# Patient Record
Sex: Male | Born: 1955 | Race: White | Hispanic: No | Marital: Married | Smoking: Never smoker
Health system: Southern US, Community
[De-identification: ages and names within clinical notes are randomized; demographics above are authoritative.]

## PROBLEM LIST (undated history)

## (undated) DIAGNOSIS — Z85820 Personal history of malignant melanoma of skin: Secondary | ICD-10-CM

## (undated) DIAGNOSIS — Z860101 Personal history of adenomatous and serrated colon polyps: Secondary | ICD-10-CM

## (undated) DIAGNOSIS — E785 Hyperlipidemia, unspecified: Secondary | ICD-10-CM

## (undated) DIAGNOSIS — F419 Anxiety disorder, unspecified: Secondary | ICD-10-CM

## (undated) DIAGNOSIS — G43909 Migraine, unspecified, not intractable, without status migrainosus: Secondary | ICD-10-CM

## (undated) DIAGNOSIS — E786 Lipoprotein deficiency: Secondary | ICD-10-CM

## (undated) DIAGNOSIS — E7800 Pure hypercholesterolemia, unspecified: Secondary | ICD-10-CM

## (undated) DIAGNOSIS — H409 Unspecified glaucoma: Secondary | ICD-10-CM

## (undated) DIAGNOSIS — H9313 Tinnitus, bilateral: Secondary | ICD-10-CM

## (undated) DIAGNOSIS — F32A Depression, unspecified: Secondary | ICD-10-CM

## (undated) DIAGNOSIS — D1803 Hemangioma of intra-abdominal structures: Secondary | ICD-10-CM

## (undated) DIAGNOSIS — D509 Iron deficiency anemia, unspecified: Secondary | ICD-10-CM

## (undated) DIAGNOSIS — N200 Calculus of kidney: Secondary | ICD-10-CM

## (undated) DIAGNOSIS — C801 Malignant (primary) neoplasm, unspecified: Secondary | ICD-10-CM

## (undated) DIAGNOSIS — Z8042 Family history of malignant neoplasm of prostate: Secondary | ICD-10-CM

## (undated) DIAGNOSIS — F329 Major depressive disorder, single episode, unspecified: Secondary | ICD-10-CM

## (undated) DIAGNOSIS — R1013 Epigastric pain: Secondary | ICD-10-CM

## (undated) DIAGNOSIS — F341 Dysthymic disorder: Secondary | ICD-10-CM

## (undated) DIAGNOSIS — R12 Heartburn: Secondary | ICD-10-CM

## (undated) DIAGNOSIS — R002 Palpitations: Secondary | ICD-10-CM

## (undated) DIAGNOSIS — H401130 Primary open-angle glaucoma, bilateral, stage unspecified: Secondary | ICD-10-CM

## (undated) HISTORY — DX: Dysthymic disorder: F34.1

## (undated) HISTORY — DX: Unspecified glaucoma: H40.9

## (undated) HISTORY — DX: Hyperlipidemia, unspecified: E78.5

## (undated) HISTORY — DX: Tinnitus, bilateral: H93.13

## (undated) HISTORY — DX: Migraine, unspecified, not intractable, without status migrainosus: G43.909

## (undated) HISTORY — DX: Calculus of kidney: N20.0

## (undated) HISTORY — DX: Primary open-angle glaucoma, bilateral, stage unspecified: H40.1130

## (undated) HISTORY — DX: Heartburn: R12

## (undated) HISTORY — DX: Depression, unspecified: F32.A

## (undated) HISTORY — DX: Anxiety disorder, unspecified: F41.9

## (undated) HISTORY — DX: Personal history of adenomatous and serrated colon polyps: Z86.0101

## (undated) HISTORY — DX: Epigastric pain: R10.13

## (undated) HISTORY — DX: Family history of malignant neoplasm of prostate: Z80.42

## (undated) HISTORY — DX: Gilbert syndrome: E80.4

## (undated) HISTORY — PX: OTHER SURGICAL HISTORY: SHX169

## (undated) HISTORY — DX: Lipoprotein deficiency: E78.6

## (undated) HISTORY — DX: Malignant (primary) neoplasm, unspecified: C80.1

## (undated) HISTORY — DX: Palpitations: R00.2

## (undated) HISTORY — DX: Hemangioma of intra-abdominal structures: D18.03

## (undated) HISTORY — DX: Personal history of malignant melanoma of skin: Z85.820

## (undated) HISTORY — DX: Iron deficiency anemia, unspecified: D50.9

## (undated) HISTORY — PX: COLONOSCOPY: SHX174

## (undated) HISTORY — DX: Pure hypercholesterolemia, unspecified: E78.00

---
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
## 2016-10-31 DIAGNOSIS — H409 Unspecified glaucoma: Secondary | ICD-10-CM | POA: Insufficient documentation

## 2018-09-13 ENCOUNTER — Other Ambulatory Visit: Payer: Self-pay | Admitting: Internal Medicine

## 2018-09-13 DIAGNOSIS — R221 Localized swelling, mass and lump, neck: Secondary | ICD-10-CM

## 2018-09-17 ENCOUNTER — Ambulatory Visit
Admission: RE | Admit: 2018-09-17 | Discharge: 2018-09-17 | Disposition: A | Discharge status: Home / Self Care | Payer: Commercial Managed Care - PPO | Source: Ambulatory Visit | Attending: Internal Medicine | Admitting: Internal Medicine

## 2018-09-17 DIAGNOSIS — R221 Localized swelling, mass and lump, neck: Secondary | ICD-10-CM

## 2018-09-17 MED ORDER — IOPAMIDOL (ISOVUE-300) INJECTION 61%
75.0000 mL | Freq: Once | INTRAVENOUS | Status: AC | PRN
  Administered 2018-09-17: 75 mL via INTRAVENOUS

## 2018-12-10 DIAGNOSIS — H5310 Unspecified subjective visual disturbances: Secondary | ICD-10-CM | POA: Diagnosis not present

## 2018-12-10 DIAGNOSIS — H401111 Primary open-angle glaucoma, right eye, mild stage: Secondary | ICD-10-CM | POA: Diagnosis not present

## 2018-12-10 DIAGNOSIS — H40022 Open angle with borderline findings, high risk, left eye: Secondary | ICD-10-CM | POA: Diagnosis not present

## 2018-12-10 DIAGNOSIS — H524 Presbyopia: Secondary | ICD-10-CM | POA: Diagnosis not present

## 2019-05-27 ENCOUNTER — Encounter: Payer: Self-pay | Admitting: Gastroenterology

## 2019-11-04 ENCOUNTER — Ambulatory Visit (INDEPENDENT_AMBULATORY_CARE_PROVIDER_SITE_OTHER): Payer: Medicare Other

## 2019-11-04 ENCOUNTER — Ambulatory Visit (INDEPENDENT_AMBULATORY_CARE_PROVIDER_SITE_OTHER): Payer: Medicare Other | Admitting: Podiatry

## 2019-11-04 ENCOUNTER — Other Ambulatory Visit: Payer: Self-pay

## 2019-11-04 ENCOUNTER — Encounter: Payer: Self-pay | Admitting: Podiatry

## 2019-11-04 VITALS — BP 128/79 | HR 59 | Temp 97.3°F

## 2019-11-04 DIAGNOSIS — M778 Other enthesopathies, not elsewhere classified: Secondary | ICD-10-CM

## 2019-11-04 DIAGNOSIS — B070 Plantar wart: Secondary | ICD-10-CM | POA: Diagnosis not present

## 2019-11-04 NOTE — Progress Notes (Signed)
Subjective:   Patient ID: Jeffrey Liu, male   DOB: 64 y.o.   MRN: [id number]   HPI Patient presents with a large lesion plantar aspect left heel that is been present for a long time and for the last couple years has been trying medicine on.  States he thinks is been there for at least 10 years and also he complains about pain around the lateral side of his foot stating that at times he is developed hematoma.  States that it does get sore on the heel and causes him to walk different and patient does not smoke likes to be active   Review of Systems  All other systems reviewed and are negative.       Objective:  Physical Exam Vitals and nursing note reviewed.  Constitutional:      Appearance: He is well-developed.  Pulmonary:     Effort: Pulmonary effort is normal.  Musculoskeletal:        General: Normal range of motion.  Skin:    General: Skin is warm.  Neurological:     Mental Status: He is alert.     Neurovascular status found to be intact muscle strength was found to be adequate range of motion within normal limits.  Patient is found to have a large mass plantar aspect left heel medial side measuring about 2.3 cm x 1.5 cm that is painful when pressed and has pinpoint bleeding upon debridement.  On the lateral side left foot there is some irritation around the fifth metatarsal head left but I did not note any breakdown of tissue     Assessment:  Verruca plantaris plantar aspect left heel with inflammation around the fifth MPJ left that is probably compensatory     Plan:  H&P reviewed all conditions and I recommended removal of the mass due to longstanding nature and treatments he is done himself.  I did inject with 120 mg Xylocaine with epinephrine sterile prep done and using sterile instrumentation I excised the mass entirely placed a small amount of phenol on the base in case was any cells remaining.  I applied sterile dressing and padding and instructed on elevation  compression and patient will be seen back to recheck again in the next 2 weeks and I did send this off to pathology.  X-rays were negative for any signs of calcifications or any kind of space-occupying lesions or other pathology

## 2019-11-04 NOTE — Patient Instructions (Signed)

## 2019-11-18 ENCOUNTER — Encounter: Payer: Self-pay | Admitting: Podiatry

## 2019-11-18 ENCOUNTER — Other Ambulatory Visit: Payer: Self-pay

## 2019-11-18 ENCOUNTER — Ambulatory Visit (INDEPENDENT_AMBULATORY_CARE_PROVIDER_SITE_OTHER): Payer: Medicare Other | Admitting: Podiatry

## 2019-11-18 DIAGNOSIS — B070 Plantar wart: Secondary | ICD-10-CM | POA: Diagnosis not present

## 2019-11-18 DIAGNOSIS — M778 Other enthesopathies, not elsewhere classified: Secondary | ICD-10-CM | POA: Diagnosis not present

## 2019-11-18 NOTE — Progress Notes (Signed)
Subjective:   Patient ID: Jeffrey Liu, male   DOB: 64 y.o.   MRN: [id number]   HPI Patient presents stating that it seems to be feeling pretty good in the outside the foot seems pretty good and it still draining but much better than previous   ROS      Objective:  Physical Exam  Neurovascular status intact with patient's plantar site healing well with a good granular base no drainage currently and mild inflammation outside fifth metatarsal head left but improved     Assessment:  Doing well with verruca plantaris plantar left that was excised and inflamed fifth MPJ left     Plan:  H&P reviewed condition how good it looks and reapplied sterile dressing and we will attempt to get the pathology back from the lab.  Patient will be seen back on an as-needed basis and if the fifth MPJ gets sore may require treatment

## 2019-12-20 ENCOUNTER — Telehealth: Payer: Self-pay | Admitting: Gastroenterology

## 2019-12-20 NOTE — Telephone Encounter (Signed)
Dr. Rush Landmark is Doc of the Day for 12/20/19 PM. Records placed on his desk for review.  Patient, Dr. Ebony Hail states that he had a poor prep from [facility] last year. He states that he prepped like he should but still had a negative outcome and was told to come back this year for another colon. Patient does not want to go back to [facility] stating that he felt like he did not get good care there.

## 2019-12-20 NOTE — Telephone Encounter (Signed)
I have reviewed the records that are available.    Looks like the patient had history of colon polyps in 2015 both a sessile serrated adenoma as well as a tubular adenoma one that required a hot snare and another that was cold snared-this was done in [place].  2020 colonoscopy in [place] [facility] suggested that there was a tortuous colon with stool in the entire colon.  Not sure what preparation he took at that time.  Based on the incomplete preparation the patient does require a repeat colonoscopy for history of colon polyps.  I would recommend that the patient either undergo a week of daily MiraLAX 1-2 times daily and subsequent 1 day preparation.  Or a 2-day prep so that we ensure we have no issues with stool.    If the patient would like to be seen in clinic first to discuss things I am happy to do that and he can be scheduled as able but if he feels comfortable with notation above then he can be scheduled directly for colonoscopy.  We will have notes scanned into the chart.  Justice Britain, MD [facility] # [phone]

## 2019-12-23 NOTE — Telephone Encounter (Signed)
Pre Visit and Colon scheduled.

## 2020-01-08 ENCOUNTER — Ambulatory Visit (AMBULATORY_SURGERY_CENTER): Payer: Self-pay

## 2020-01-08 ENCOUNTER — Other Ambulatory Visit: Payer: Self-pay

## 2020-01-08 VITALS — Temp 98.0°F | Ht 76.0 in | Wt 189.0 lb

## 2020-01-08 DIAGNOSIS — Z1211 Encounter for screening for malignant neoplasm of colon: Secondary | ICD-10-CM

## 2020-01-08 DIAGNOSIS — Z01818 Encounter for other preprocedural examination: Secondary | ICD-10-CM

## 2020-01-08 NOTE — Progress Notes (Signed)
No egg or soy allergy known to patient  No issues with past sedation with any surgeries  or procedures, no intubation problems  No diet pills per patient No home 02 use per patient  No blood thinners per patient  Pt denies issues with constipation  No A fib or A flutter  EMMI video sent to pt's e mail   Pt aware of care partner requirements and Covid safety procedures.

## 2020-01-21 ENCOUNTER — Encounter: Payer: Self-pay | Admitting: Gastroenterology

## 2020-01-22 ENCOUNTER — Telehealth: Payer: Self-pay

## 2020-01-22 NOTE — Telephone Encounter (Signed)
Called pt to confirm that pt went for COVID test for upcoming scheduled colonoscopy.  Pt reported that he received 2nd dose of COVID vaccine in October as a part of a clinical trial.

## 2020-01-23 ENCOUNTER — Other Ambulatory Visit: Payer: Self-pay

## 2020-01-23 ENCOUNTER — Encounter: Payer: Self-pay | Admitting: Gastroenterology

## 2020-01-23 ENCOUNTER — Ambulatory Visit (AMBULATORY_SURGERY_CENTER): Payer: Medicare Other | Admitting: Gastroenterology

## 2020-01-23 VITALS — BP 106/68 | HR 52 | Temp 96.8°F | Resp 12 | Ht 76.0 in | Wt 189.0 lb

## 2020-01-23 DIAGNOSIS — D122 Benign neoplasm of ascending colon: Secondary | ICD-10-CM

## 2020-01-23 DIAGNOSIS — D125 Benign neoplasm of sigmoid colon: Secondary | ICD-10-CM

## 2020-01-23 DIAGNOSIS — Z1211 Encounter for screening for malignant neoplasm of colon: Secondary | ICD-10-CM

## 2020-01-23 DIAGNOSIS — Z86010 Personal history of colonic polyps: Secondary | ICD-10-CM | POA: Diagnosis not present

## 2020-01-23 MED ORDER — SODIUM CHLORIDE 0.9 % IV SOLN: 500.0000 mL | Freq: Once | INTRAVENOUS | Status: DC

## 2020-01-23 NOTE — Progress Notes (Signed)
Vitals-CW Temp-JB  Pt's states no medical or surgical changes since previsit or office visit. 

## 2020-01-23 NOTE — Patient Instructions (Signed)
YOU HAD AN ENDOSCOPIC PROCEDURE TODAY AT [facility]:   Refer to the procedure report that was given to you for any specific questions about what was found during the examination.  If the procedure report does not answer your questions, please call your gastroenterologist to clarify.  If you requested that your care partner not be given the details of your procedure findings, then the procedure report has been included in a sealed envelope for you to review at your convenience later.  YOU SHOULD EXPECT: Some feelings of bloating in the abdomen. Passage of more gas than usual.  Walking can help get rid of the air that was put into your GI tract during the procedure and reduce the bloating. If you had a lower endoscopy (such as a colonoscopy or flexible sigmoidoscopy) you may notice spotting of blood in your stool or on the toilet paper. If you underwent a bowel prep for your procedure, you may not have a normal bowel movement for a few days.  Please Note:  You might notice some irritation and congestion in your nose or some drainage.  This is from the oxygen used during your procedure.  There is no need for concern and it should clear up in a day or so.  SYMPTOMS TO REPORT IMMEDIATELY:   Following lower endoscopy (colonoscopy or flexible sigmoidoscopy):  Excessive amounts of blood in the stool  Significant tenderness or worsening of abdominal pains  Swelling of the abdomen that is new, acute  Fever of 100F or higher   For urgent or emergent issues, a gastroenterologist can be reached at any hour by calling [phone]. Do not use MyChart messaging for urgent concerns.    DIET:  We do recommend a small meal at first, but then you may proceed to your regular diet.  Drink plenty of fluids but you should avoid alcoholic beverages for 24 hours. Follow a High Fiber Diet.  MEDICATIONS: Continue present medications. Consider using supplemental fiber such as Citrucel, Fibercon,  Konsyl or Metamucil.  Please see handouts given to you by your recovery nurse.  ACTIVITY:  You should plan to take it easy for the rest of today and you should NOT DRIVE or use heavy machinery until tomorrow (because of the sedation medicines used during the test).    FOLLOW UP: Our staff will call the number listed on your records 48-72 hours following your procedure to check on you and address any questions or concerns that you may have regarding the information given to you following your procedure. If we do not reach you, we will leave a message.  We will attempt to reach you two times.  During this call, we will ask if you have developed any symptoms of COVID 19. If you develop any symptoms (ie: fever, flu-like symptoms, shortness of breath, cough etc.) before then, please call [phone].  If you test positive for Covid 19 in the 2 weeks post procedure, please call and report this information to [place].    If any biopsies were taken you will be contacted by phone or by letter within the next 1-3 weeks.  Please call us at [phone] if you have not heard about the biopsies in 3 weeks.   Thank you for allowing [place] to provide for your healthcare needs today.   SIGNATURES/CONFIDENTIALITY: You and/or your care partner have signed paperwork which will be entered into your electronic medical record.  These signatures attest to the fact that that the information above  on your After Visit Summary has been reviewed and is understood.  Full responsibility of the confidentiality of this discharge information lies with you and/or your care-partner.

## 2020-01-23 NOTE — Progress Notes (Signed)
PT taken to PACU. Monitors in place. VSS. Report given to RN. 

## 2020-01-23 NOTE — Progress Notes (Signed)
Called to room to assist during endoscopic procedure.  Patient ID and intended procedure confirmed with present staff. Received instructions for my participation in the procedure from the performing physician.  

## 2020-01-23 NOTE — Op Note (Signed)
[facility] Patient Name: Jeffrey Liu Procedure Date: 01/23/2020 8:44 AM MRN: [id number] Endoscopist: Justice Britain , MD Age: 64 Referring MD:  Date of Birth: [birth year] Gender: Male Account #: [id number] Procedure:                Colonoscopy Indications:              Surveillance: Personal history of adenomatous                            polyps on last colonoscopy > 5 years ago Medicines:                Monitored Anesthesia Care Procedure:                Pre-Anesthesia Assessment:                           - Prior to the procedure, a History and Physical                            was performed, and patient medications and                            allergies were reviewed. The patient's tolerance of                            previous anesthesia was also reviewed. The risks                            and benefits of the procedure and the sedation                            options and risks were discussed with the patient.                            All questions were answered, and informed consent                            was obtained. Prior Anticoagulants: The patient has                            taken no previous anticoagulant or antiplatelet                            agents. ASA Grade Assessment: I - A normal, healthy                            patient. After reviewing the risks and benefits,                            the patient was deemed in satisfactory condition to                            undergo the procedure.  After obtaining informed consent, the colonoscope                            was passed under direct vision. Throughout the                            procedure, the patient's blood pressure, pulse, and                            oxygen saturations were monitored continuously. The                            Colonoscope was introduced through the anus and                            advanced to the the cecum,  identified by                            appendiceal orifice and ileocecal valve. The                            colonoscopy was somewhat difficult due to a                            redundant colon. Successful completion of the                            procedure was aided by changing the patient's                            position, using manual pressure, withdrawing and                            reinserting the scope, straightening and shortening                            the scope to obtain bowel loop reduction and using                            scope torsion. The patient tolerated the procedure.                            The quality of the bowel preparation was good. The                            ileocecal valve, appendiceal orifice, and rectum                            were photographed. Scope In: 8:48:42 AM Scope Out: 9:17:37 AM Scope Withdrawal Time: 0 hours 19 minutes 17 seconds  Total Procedure Duration: 0 hours 28 minutes 55 seconds  Findings:                 The digital rectal exam findings include  hemorrhoids. Pertinent negatives include no                            palpable rectal lesions.                           The colon (entire examined portion) was moderately                            redundant.                           Four sessile polyps were found in the sigmoid colon                            (1) and ascending colon (3). The polyps were 3 to 7                            mm in size. These polyps were removed with a cold                            snare. Resection and retrieval were complete.                           Small-mouthed mulitple diverticula were found in                            the recto-sigmoid colon/sigmoid colon and ascending                            colon (single on the right side).                           Normal mucosa was found in the entire colon                            otherwise.                            Non-bleeding non-thrombosed internal hemorrhoids                            were found during retroflexion, during perianal                            exam and during digital exam. The hemorrhoids were                            Grade II (internal hemorrhoids that prolapse but                            reduce spontaneously). Complications:            No immediate complications. Estimated Blood Loss:     Estimated blood loss was minimal. Impression:               -  Hemorrhoids found on digital rectal exam.                           - Redundant colon.                           - Four 3 to 7 mm polyps in the sigmoid colon and in                            the ascending colon, removed with a cold snare.                            Resected and retrieved.                           - Diverticulosis in the recto-sigmoid colon, in the                            sigmoid colon and in the ascending colon (single on                            right side)                           - Normal mucosa in the entire examined colon.                           - Non-bleeding non-thrombosed internal hemorrhoids. Recommendation:           - The patient will be observed post-procedure,                            until all discharge criteria are met.                           - Discharge patient to home.                           - Patient has a contact number available for                            emergencies. The signs and symptoms of potential                            delayed complications were discussed with the                            patient. Return to normal activities tomorrow.                            Written discharge instructions were provided to the                            patient.                           -  High fiber diet.                           - Consider use fiber, for example Citrucel,                            Fibercon, Konsyl or Metamucil.                           -  Continue present medications.                           - Await pathology results.                           - Repeat colonoscopy in 3 years for surveillance                            based on pathology results and findings of                            adenomatous tissue.                           - The findings and recommendations were discussed                            with the patient.                           - The findings and recommendations were discussed                            with the patient's family. Justice Britain, MD 01/23/2020 9:26:17 AM

## 2020-01-27 ENCOUNTER — Telehealth: Payer: Self-pay

## 2020-01-27 NOTE — Telephone Encounter (Signed)
  Follow up Call-  Call back number 01/23/2020  Post procedure Call Back phone  # [phone]  Permission to leave phone message Yes     Patient questions:  Do you have a fever, pain , or abdominal swelling? No. Pain Score  0 *  Have you tolerated food without any problems? Yes.    Have you been able to return to your normal activities? Yes.    Do you have any questions about your discharge instructions: Diet   No. Medications  No. Follow up visit  No.  Do you have questions or concerns about your Care? No.  Actions: * If pain score is 4 or above: No action needed, pain <4.  1. Have you developed a fever since your procedure? no  2.   Have you had an respiratory symptoms (SOB or cough) since your procedure? no  3.   Have you tested positive for COVID 19 since your procedure no  4.   Have you had any family members/close contacts diagnosed with the COVID 19 since your procedure?  no   If yes to any of these questions please route to Joylene John, RN and Erenest Rasher, RN

## 2020-01-28 ENCOUNTER — Encounter: Payer: Self-pay | Admitting: Gastroenterology

## 2020-07-30 IMAGING — CT CT NECK W/ CM
5 of 6 series · 14 of 33 positions shown, 16 images · IV contrast (iopamidol)
Comparison: None.

CLINICAL DATA: Fullness of neck.  Personal history of melanoma.

Creatinine was obtained on site at [HOSPITAL] at [REDACTED].
Results: Creatinine 0.9 mg/dL.
EXAM:
CT NECK WITH CONTRAST
TECHNIQUE: Multidetector CT imaging of the neck was performed using the
standard protocol following the bolus administration of intravenous
contrast.
CONTRAST:  75mL [id number] IOPAMIDOL (8HL1HW-RHH) INJECTION 61%

[Series 3: neck 2.00 br40 s3 ax st · axial · 0.47mm/px · z∈[-739,-643]mm · 2 of 145 slices shown, 3 images]
[im 49/145  soft-tissue]
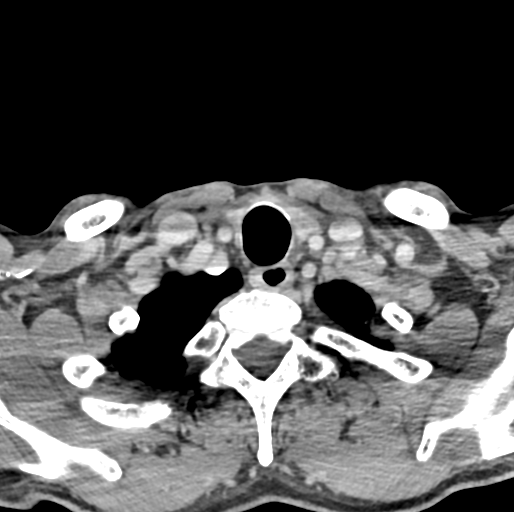
[im 49/145  bone]
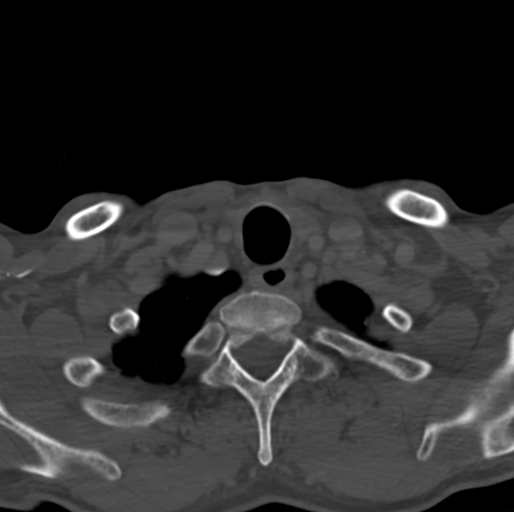
[im 97/145  bone]
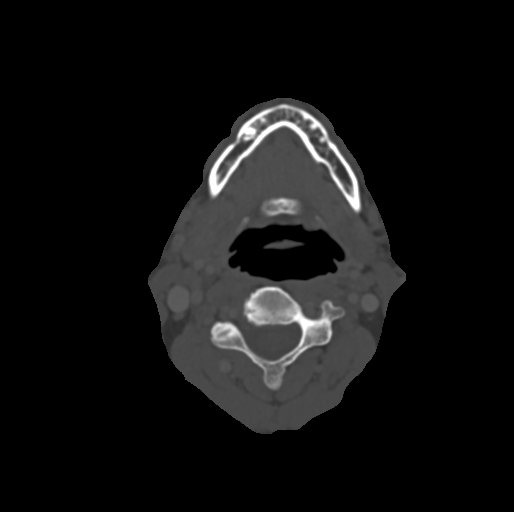

[Series 5: neck 2.00 br36 s3 ax hyoid · axial · 0.47mm/px · z∈[-756,-662]mm · 2 of 145 slices shown]
[im 49/145  bone]
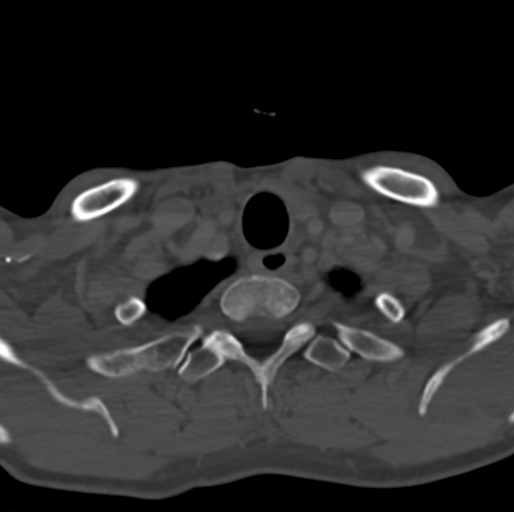
[im 97/145  bone]
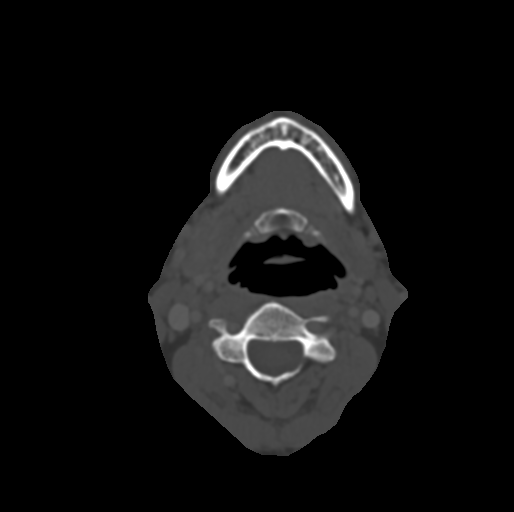

[Series 7: neck 2.00 br60 s3 ax bone · axial · 0.47mm/px · z∈[-739,-643]mm · 2 of 145 slices shown]
[im 49/145  bone]
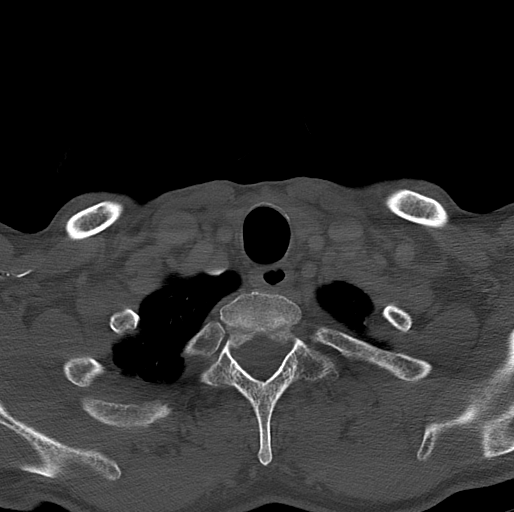
[im 97/145  bone]
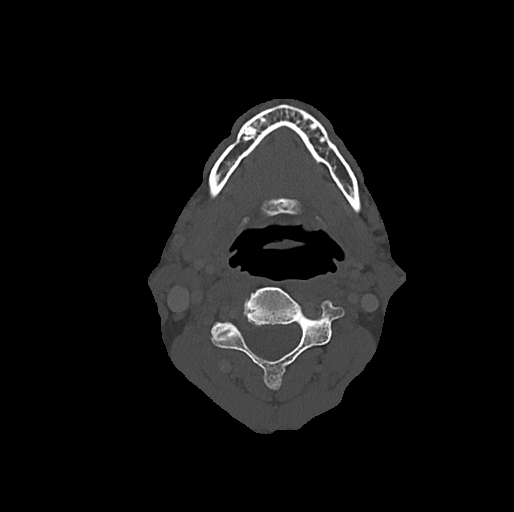

[Series 11: neck 2.00 br40 s3 (person_name) · coronal · 0.47mm/px · 3 of 120 slices shown]
[im 24/120  bone]
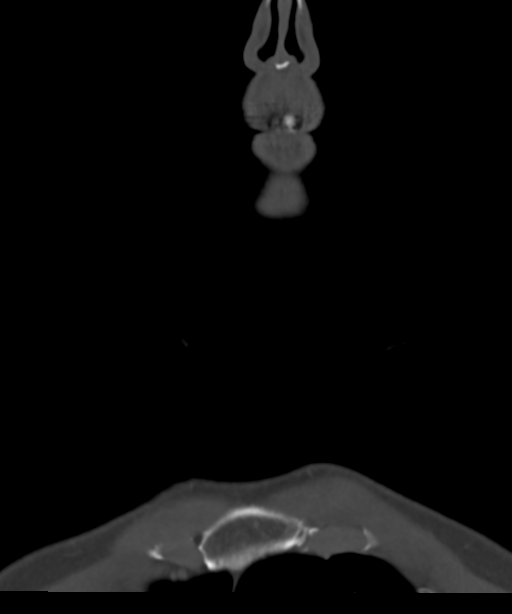
[im 48/120  bone]
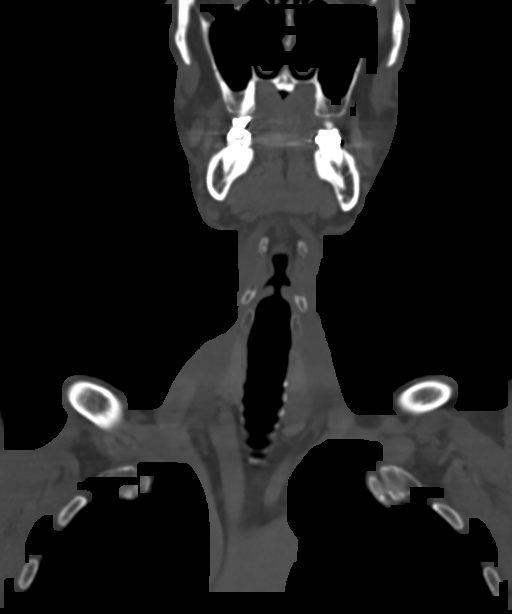
[im 72/120  bone]
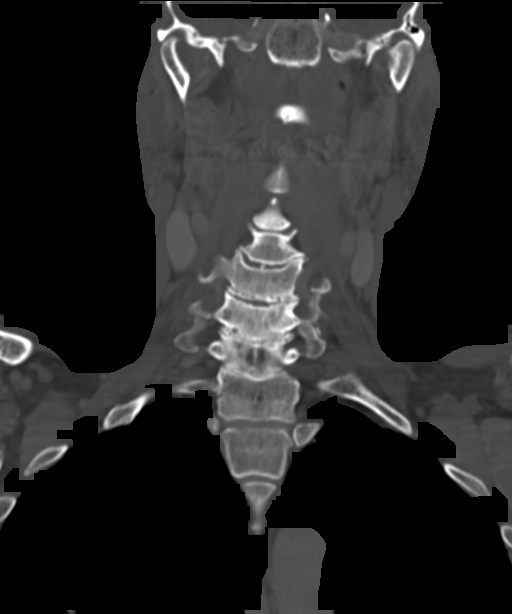

[Series 13: neck 2.00 br40 s3 sag st · sagittal · 0.47mm/px · 5 of 121 slices shown, 6 images]
[im 41/121  bone]
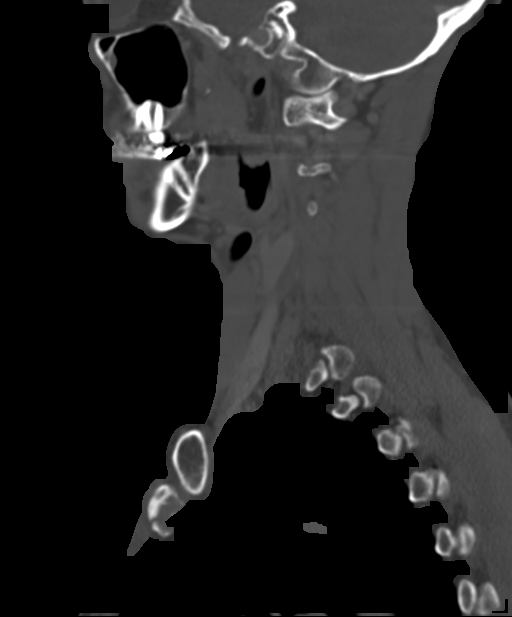
[im 51/121  bone]
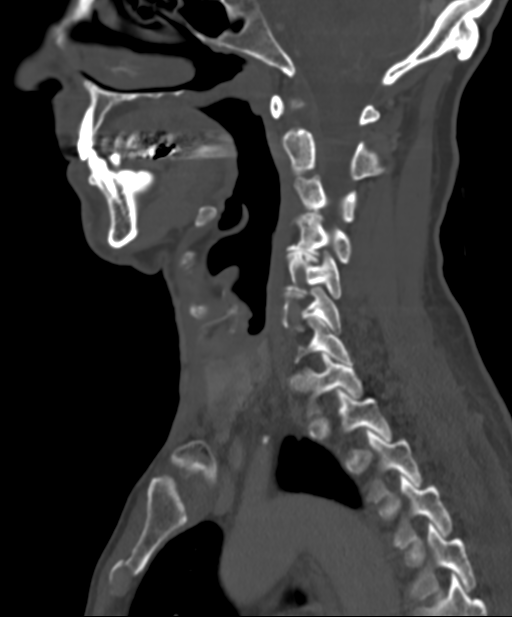
[im 61/121  soft-tissue]
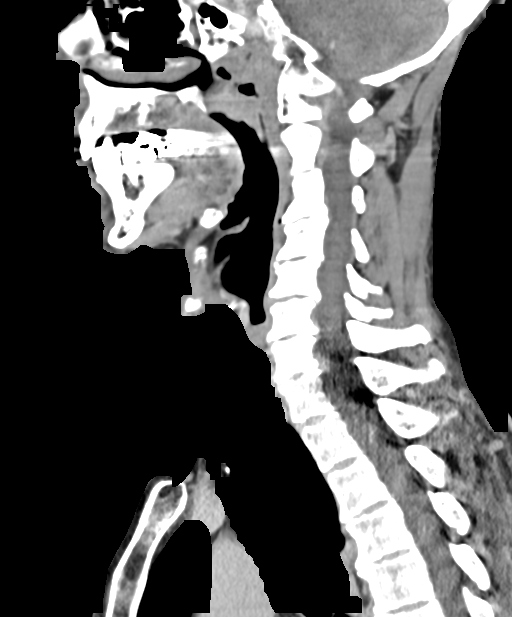
[im 61/121  bone]
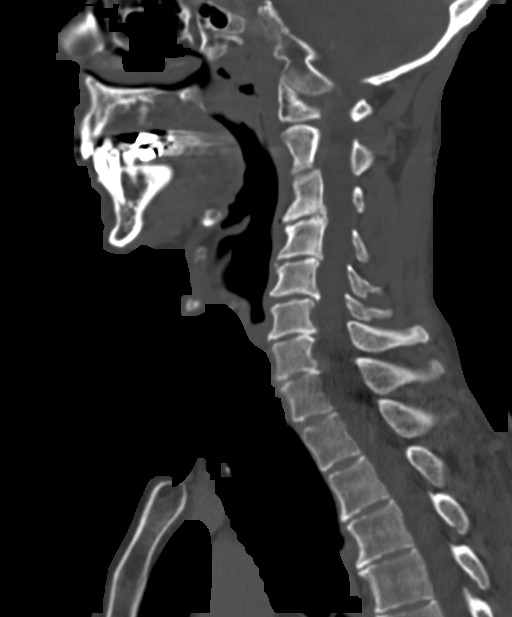
[im 71/121  bone]
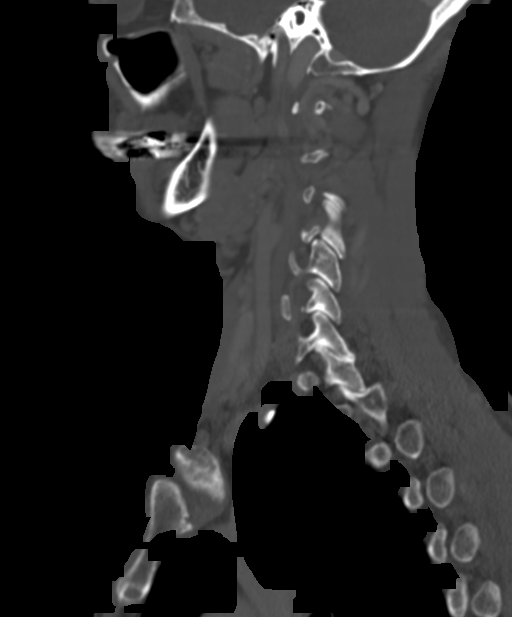
[im 81/121  bone]
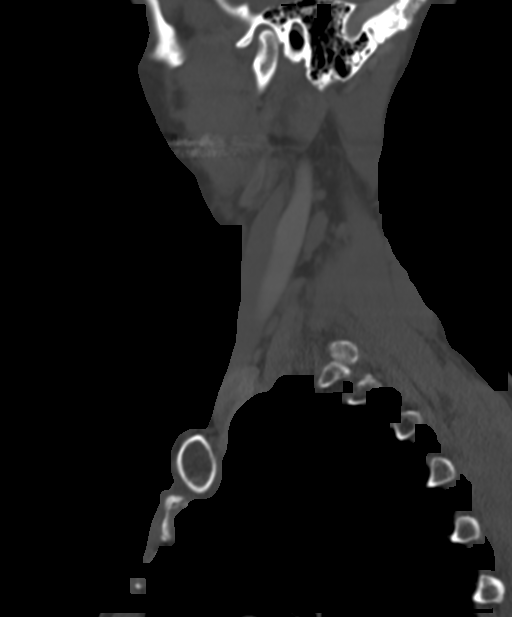

[14 of 33 positions shown; findings below may reference images not displayed]

FINDINGS: Pharynx and larynx: Nasopharynx is unremarkable. Soft palate is
within normal limits. Tongue base is normal. The epiglottis is
normal. Hypopharynx is unremarkable. Vocal cords are midline and
symmetric. Trachea is unremarkable.

Salivary glands: The submandibular and parotid glands are normal
bilaterally.

Thyroid: Subcentimeter thyroid nodules are present. There is some
calcification on the right. No dominant nodule is present.

Lymph nodes: No significant cervical adenopathy is present.

Vascular: Atherosclerotic calcifications are present subclavian
arteries bilaterally. No significant stenosis is present.

Limited intracranial: Unremarkable.

Visualized orbits: Globes and orbits are within normal limits.

Mastoids and visualized paranasal sinuses: Minimal mucosal
thickening. The paranasal is present in the left maxillary sinus
sinuses and mastoid air cells are otherwise clear.

Skeleton: Multilevel endplate degenerative changes are present.
Uncovertebral spurring and foraminal stenosis is greatest on the
left at C4-5, C5-6, and C6-7. No focal lytic or blastic lesions are
present.

Upper chest: The lung apices are clear without focal nodule, mass,
or airspace disease.
IMPRESSION: 1. No focal soft tissue mass lesion or adenopathy in the neck.
2. Airway and digestive tract are patent.
3. Calcifications in the right palatine tonsil likely related to
remote infection.
4. Multilevel degenerative changes within the cervical spine are
worse on the left.

## 2020-10-05 ENCOUNTER — Encounter (INDEPENDENT_AMBULATORY_CARE_PROVIDER_SITE_OTHER): Payer: Self-pay | Admitting: Otolaryngology

## 2020-10-05 ENCOUNTER — Other Ambulatory Visit: Payer: Self-pay

## 2020-10-05 ENCOUNTER — Ambulatory Visit (INDEPENDENT_AMBULATORY_CARE_PROVIDER_SITE_OTHER): Payer: Medicare Other | Admitting: Otolaryngology

## 2020-10-05 VITALS — Temp 97.3°F

## 2020-10-05 DIAGNOSIS — H9313 Tinnitus, bilateral: Secondary | ICD-10-CM | POA: Diagnosis not present

## 2020-10-05 NOTE — Progress Notes (Signed)
HPI: Jeffrey Liu is a 64 y.o. male who presents for evaluation of tinnitus.  Patient apparently participated in [employer] study for Covid vaccine and following his second vaccine in October 2020 he developed tinnitus in both ears.  He has not noticed any hearing loss and has not previously had a hearing test.  He denies loud noise exposure. He is contemplating receiving a booster but is not sure he wants to have a Pfizer booster as this shot initially produced his tinnitus.  Past Medical History:  Diagnosis Date  . Cancer ([place])    skin cancers/melanoma and basal cell.  . Depression   . Glaucoma   . Migraines    Past Surgical History:  Procedure Laterality Date  . COLONOSCOPY  2015, 2020  . phargeal diverticulum     Social History   Socioeconomic History  . Marital status: Married    Spouse name: Not on file  . Number of children: Not on file  . Years of education: Not on file  . Highest education level: Not on file  Occupational History  . Not on file  Tobacco Use  . Smoking status: Never Smoker  . Smokeless tobacco: Never Used  Vaping Use  . Vaping Use: Never used  Substance and Sexual Activity  . Alcohol use: Yes    Comment: occ  . Drug use: Never  . Sexual activity: Not on file  Other Topics Concern  . Not on file  Social History Narrative  . Not on file   Social Determinants of Health   Financial Resource Strain:   . Difficulty of Paying Living Expenses: Not on file  Food Insecurity:   . Worried About Charity fundraiser in the Last Year: Not on file  . Ran Out of Food in the Last Year: Not on file  Transportation Needs:   . Lack of Transportation (Medical): Not on file  . Lack of Transportation (Non-Medical): Not on file  Physical Activity:   . Days of Exercise per Week: Not on file  . Minutes of Exercise per Session: Not on file  Stress:   . Feeling of Stress : Not on file  Social Connections:   . Frequency of Communication with Friends and Family:  Not on file  . Frequency of Social Gatherings with Friends and Family: Not on file  . Attends Religious Services: Not on file  . Active Member of Clubs or Organizations: Not on file  . Attends Archivist Meetings: Not on file  . Marital Status: Not on file   Family History  Problem Relation Age of Onset  . Colon cancer Neg Hx   . Colon polyps Neg Hx   . Esophageal cancer Neg Hx   . Rectal cancer Neg Hx   . Stomach cancer Neg Hx    No Known Allergies Prior to Admission medications   Medication Sig Start Date End Date Taking? Authorizing Provider  FLUoxetine (PROZAC) 20 MG tablet Take 20 mg by mouth daily. 10/15/19  Yes [provider]     Positive ROS: Otherwise negative  All other systems have been reviewed and were otherwise negative with the exception of those mentioned in the HPI and as above.  Physical Exam: Constitutional: Alert, well-appearing, no acute distress Ears: External ears without lesions or tenderness. Ear canals are clear bilaterally with intact, clear TMs bilaterally. Nasal: External nose without lesions.  Mild septal deformity.. Clear nasal passages otherwise. Oral: Lips and gums without lesions. Tongue and palate mucosa  without lesions. Posterior oropharynx clear. Neck: No palpable adenopathy or masses Respiratory: Breathing comfortably  Skin: No facial/neck lesions or rash noted.  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  In the right ear hearing at [facility] was 30 dB and at 8K was 40 dB.  Hearing on the left side was 25 dB at 4K and 20dB at 8K. SRT's were 20 DB on the right and 15 dB on the left.  He had type A tympanograms bilaterally.  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 Also gave him some samples of Lipo flavonoid to try as this is beneficial in some people with tinnitus. Concerning the booster shot I would recommend obtaining the booster shot but the type of shot would be up to him.  I am not that familiar with repeat booster shot causing worsening of the tinnitus.  And reviewed this with him.  Radene Journey, MD

## 2020-11-06 ENCOUNTER — Encounter (INDEPENDENT_AMBULATORY_CARE_PROVIDER_SITE_OTHER): Payer: Self-pay

## 2021-01-29 DIAGNOSIS — H40033 Anatomical narrow angle, bilateral: Secondary | ICD-10-CM | POA: Diagnosis not present

## 2021-01-29 DIAGNOSIS — H524 Presbyopia: Secondary | ICD-10-CM | POA: Diagnosis not present

## 2021-01-29 DIAGNOSIS — H2513 Age-related nuclear cataract, bilateral: Secondary | ICD-10-CM | POA: Diagnosis not present

## 2021-01-29 DIAGNOSIS — H401111 Primary open-angle glaucoma, right eye, mild stage: Secondary | ICD-10-CM | POA: Diagnosis not present

## 2021-01-29 DIAGNOSIS — H40022 Open angle with borderline findings, high risk, left eye: Secondary | ICD-10-CM | POA: Diagnosis not present

## 2021-05-17 DIAGNOSIS — Z0100 Encounter for examination of eyes and vision without abnormal findings: Secondary | ICD-10-CM | POA: Diagnosis not present

## 2021-06-24 DIAGNOSIS — R1013 Epigastric pain: Secondary | ICD-10-CM | POA: Diagnosis not present

## 2021-06-24 DIAGNOSIS — Z85820 Personal history of malignant melanoma of skin: Secondary | ICD-10-CM | POA: Diagnosis not present

## 2021-06-24 DIAGNOSIS — Z8042 Family history of malignant neoplasm of prostate: Secondary | ICD-10-CM | POA: Diagnosis not present

## 2021-06-24 DIAGNOSIS — R69 Illness, unspecified: Secondary | ICD-10-CM | POA: Diagnosis not present

## 2021-06-24 DIAGNOSIS — G43709 Chronic migraine without aura, not intractable, without status migrainosus: Secondary | ICD-10-CM | POA: Diagnosis not present

## 2021-06-24 DIAGNOSIS — Z0000 Encounter for general adult medical examination without abnormal findings: Secondary | ICD-10-CM | POA: Diagnosis not present

## 2021-08-02 DIAGNOSIS — H40022 Open angle with borderline findings, high risk, left eye: Secondary | ICD-10-CM | POA: Diagnosis not present

## 2021-08-02 DIAGNOSIS — H401111 Primary open-angle glaucoma, right eye, mild stage: Secondary | ICD-10-CM | POA: Diagnosis not present

## 2021-08-02 DIAGNOSIS — H40033 Anatomical narrow angle, bilateral: Secondary | ICD-10-CM | POA: Diagnosis not present

## 2021-08-16 DIAGNOSIS — H40031 Anatomical narrow angle, right eye: Secondary | ICD-10-CM | POA: Diagnosis not present

## 2021-08-24 DIAGNOSIS — H40032 Anatomical narrow angle, left eye: Secondary | ICD-10-CM | POA: Diagnosis not present

## 2021-09-14 DIAGNOSIS — F419 Anxiety disorder, unspecified: Secondary | ICD-10-CM | POA: Diagnosis not present

## 2021-09-14 DIAGNOSIS — Z8249 Family history of ischemic heart disease and other diseases of the circulatory system: Secondary | ICD-10-CM | POA: Diagnosis not present

## 2021-09-14 DIAGNOSIS — Z803 Family history of malignant neoplasm of breast: Secondary | ICD-10-CM | POA: Diagnosis not present

## 2021-09-14 DIAGNOSIS — H409 Unspecified glaucoma: Secondary | ICD-10-CM | POA: Diagnosis not present

## 2021-09-14 DIAGNOSIS — Z85828 Personal history of other malignant neoplasm of skin: Secondary | ICD-10-CM | POA: Diagnosis not present

## 2021-09-14 DIAGNOSIS — Z881 Allergy status to other antibiotic agents status: Secondary | ICD-10-CM | POA: Diagnosis not present

## 2021-09-14 DIAGNOSIS — K219 Gastro-esophageal reflux disease without esophagitis: Secondary | ICD-10-CM | POA: Diagnosis not present

## 2021-09-14 DIAGNOSIS — R69 Illness, unspecified: Secondary | ICD-10-CM | POA: Diagnosis not present

## 2021-09-14 DIAGNOSIS — Z8042 Family history of malignant neoplasm of prostate: Secondary | ICD-10-CM | POA: Diagnosis not present

## 2021-09-16 DIAGNOSIS — L821 Other seborrheic keratosis: Secondary | ICD-10-CM | POA: Diagnosis not present

## 2021-09-16 DIAGNOSIS — D485 Neoplasm of uncertain behavior of skin: Secondary | ICD-10-CM | POA: Diagnosis not present

## 2021-09-16 DIAGNOSIS — Z85828 Personal history of other malignant neoplasm of skin: Secondary | ICD-10-CM | POA: Diagnosis not present

## 2021-09-16 DIAGNOSIS — D225 Melanocytic nevi of trunk: Secondary | ICD-10-CM | POA: Diagnosis not present

## 2021-09-16 DIAGNOSIS — L570 Actinic keratosis: Secondary | ICD-10-CM | POA: Diagnosis not present

## 2021-12-07 DIAGNOSIS — D509 Iron deficiency anemia, unspecified: Secondary | ICD-10-CM | POA: Diagnosis not present

## 2021-12-07 DIAGNOSIS — R1084 Generalized abdominal pain: Secondary | ICD-10-CM | POA: Diagnosis not present

## 2021-12-29 DIAGNOSIS — H409 Unspecified glaucoma: Secondary | ICD-10-CM | POA: Diagnosis not present

## 2021-12-29 DIAGNOSIS — H25813 Combined forms of age-related cataract, bilateral: Secondary | ICD-10-CM | POA: Diagnosis not present

## 2022-02-17 DIAGNOSIS — Z5181 Encounter for therapeutic drug level monitoring: Secondary | ICD-10-CM | POA: Diagnosis not present

## 2022-02-17 DIAGNOSIS — R748 Abnormal levels of other serum enzymes: Secondary | ICD-10-CM | POA: Diagnosis not present

## 2022-02-17 DIAGNOSIS — E786 Lipoprotein deficiency: Secondary | ICD-10-CM | POA: Diagnosis not present

## 2022-05-02 DIAGNOSIS — H40033 Anatomical narrow angle, bilateral: Secondary | ICD-10-CM | POA: Diagnosis not present

## 2022-05-02 DIAGNOSIS — H40022 Open angle with borderline findings, high risk, left eye: Secondary | ICD-10-CM | POA: Diagnosis not present

## 2022-05-02 DIAGNOSIS — H401111 Primary open-angle glaucoma, right eye, mild stage: Secondary | ICD-10-CM | POA: Diagnosis not present

## 2022-06-04 DIAGNOSIS — E785 Hyperlipidemia, unspecified: Secondary | ICD-10-CM | POA: Diagnosis not present

## 2022-06-04 DIAGNOSIS — Z85828 Personal history of other malignant neoplasm of skin: Secondary | ICD-10-CM | POA: Diagnosis not present

## 2022-06-04 DIAGNOSIS — Z791 Long term (current) use of non-steroidal anti-inflammatories (NSAID): Secondary | ICD-10-CM | POA: Diagnosis not present

## 2022-06-04 DIAGNOSIS — Z85820 Personal history of malignant melanoma of skin: Secondary | ICD-10-CM | POA: Diagnosis not present

## 2022-06-04 DIAGNOSIS — R69 Illness, unspecified: Secondary | ICD-10-CM | POA: Diagnosis not present

## 2022-06-04 DIAGNOSIS — G43909 Migraine, unspecified, not intractable, without status migrainosus: Secondary | ICD-10-CM | POA: Diagnosis not present

## 2022-06-04 DIAGNOSIS — Z8249 Family history of ischemic heart disease and other diseases of the circulatory system: Secondary | ICD-10-CM | POA: Diagnosis not present

## 2022-06-04 DIAGNOSIS — H409 Unspecified glaucoma: Secondary | ICD-10-CM | POA: Diagnosis not present

## 2022-06-04 DIAGNOSIS — Z803 Family history of malignant neoplasm of breast: Secondary | ICD-10-CM | POA: Diagnosis not present

## 2022-07-22 DIAGNOSIS — Z85820 Personal history of malignant melanoma of skin: Secondary | ICD-10-CM | POA: Diagnosis not present

## 2022-07-22 DIAGNOSIS — G43709 Chronic migraine without aura, not intractable, without status migrainosus: Secondary | ICD-10-CM | POA: Diagnosis not present

## 2022-07-22 DIAGNOSIS — Z0000 Encounter for general adult medical examination without abnormal findings: Secondary | ICD-10-CM | POA: Diagnosis not present

## 2022-07-22 DIAGNOSIS — R69 Illness, unspecified: Secondary | ICD-10-CM | POA: Diagnosis not present

## 2022-07-22 DIAGNOSIS — Z86010 Personal history of colonic polyps: Secondary | ICD-10-CM | POA: Diagnosis not present

## 2022-07-22 DIAGNOSIS — E786 Lipoprotein deficiency: Secondary | ICD-10-CM | POA: Diagnosis not present

## 2022-07-22 DIAGNOSIS — Z125 Encounter for screening for malignant neoplasm of prostate: Secondary | ICD-10-CM | POA: Diagnosis not present

## 2022-10-03 DIAGNOSIS — L218 Other seborrheic dermatitis: Secondary | ICD-10-CM | POA: Diagnosis not present

## 2022-10-03 DIAGNOSIS — L821 Other seborrheic keratosis: Secondary | ICD-10-CM | POA: Diagnosis not present

## 2022-10-03 DIAGNOSIS — D2262 Melanocytic nevi of left upper limb, including shoulder: Secondary | ICD-10-CM | POA: Diagnosis not present

## 2022-10-03 DIAGNOSIS — D2271 Melanocytic nevi of right lower limb, including hip: Secondary | ICD-10-CM | POA: Diagnosis not present

## 2022-10-03 DIAGNOSIS — D225 Melanocytic nevi of trunk: Secondary | ICD-10-CM | POA: Diagnosis not present

## 2022-10-03 DIAGNOSIS — Z85828 Personal history of other malignant neoplasm of skin: Secondary | ICD-10-CM | POA: Diagnosis not present

## 2022-10-03 DIAGNOSIS — D2261 Melanocytic nevi of right upper limb, including shoulder: Secondary | ICD-10-CM | POA: Diagnosis not present

## 2022-10-03 DIAGNOSIS — D2272 Melanocytic nevi of left lower limb, including hip: Secondary | ICD-10-CM | POA: Diagnosis not present

## 2022-10-03 DIAGNOSIS — L570 Actinic keratosis: Secondary | ICD-10-CM | POA: Diagnosis not present

## 2022-10-03 DIAGNOSIS — Z85820 Personal history of malignant melanoma of skin: Secondary | ICD-10-CM | POA: Diagnosis not present

## 2022-11-15 DIAGNOSIS — H40021 Open angle with borderline findings, high risk, right eye: Secondary | ICD-10-CM | POA: Diagnosis not present

## 2022-11-15 DIAGNOSIS — H402221 Chronic angle-closure glaucoma, left eye, mild stage: Secondary | ICD-10-CM | POA: Diagnosis not present

## 2023-02-03 ENCOUNTER — Encounter: Payer: Self-pay | Admitting: Gastroenterology

## 2023-03-06 ENCOUNTER — Ambulatory Visit (AMBULATORY_SURGERY_CENTER): Payer: Medicare HMO

## 2023-03-06 VITALS — Ht 76.0 in | Wt 185.0 lb

## 2023-03-06 DIAGNOSIS — Z86010 Personal history of colonic polyps: Secondary | ICD-10-CM

## 2023-03-06 NOTE — Progress Notes (Signed)

## 2023-03-21 ENCOUNTER — Encounter: Payer: Self-pay | Admitting: Gastroenterology

## 2023-04-05 ENCOUNTER — Ambulatory Visit (AMBULATORY_SURGERY_CENTER): Payer: Medicare HMO | Admitting: Gastroenterology

## 2023-04-05 ENCOUNTER — Encounter: Payer: Self-pay | Admitting: Gastroenterology

## 2023-04-05 VITALS — BP 98/54 | HR 47 | Temp 98.0°F | Resp 13 | Ht 76.0 in | Wt 185.0 lb

## 2023-04-05 DIAGNOSIS — D123 Benign neoplasm of transverse colon: Secondary | ICD-10-CM

## 2023-04-05 DIAGNOSIS — Z09 Encounter for follow-up examination after completed treatment for conditions other than malignant neoplasm: Secondary | ICD-10-CM | POA: Diagnosis not present

## 2023-04-05 DIAGNOSIS — K6389 Other specified diseases of intestine: Secondary | ICD-10-CM | POA: Diagnosis not present

## 2023-04-05 DIAGNOSIS — E785 Hyperlipidemia, unspecified: Secondary | ICD-10-CM | POA: Diagnosis not present

## 2023-04-05 DIAGNOSIS — F32A Depression, unspecified: Secondary | ICD-10-CM | POA: Diagnosis not present

## 2023-04-05 DIAGNOSIS — Z86010 Personal history of colonic polyps: Secondary | ICD-10-CM | POA: Diagnosis not present

## 2023-04-05 MED ORDER — SODIUM CHLORIDE 0.9 % IV SOLN: 500.0000 mL | Freq: Once | INTRAVENOUS | Status: DC

## 2023-04-05 NOTE — Progress Notes (Signed)
Called to room to assist during endoscopic procedure.  Patient ID and intended procedure confirmed with present staff. Received instructions for my participation in the procedure from the performing physician.  

## 2023-04-05 NOTE — Progress Notes (Signed)
Vss nad trans to pacu 

## 2023-04-05 NOTE — Progress Notes (Signed)
Pt's states no medical or surgical changes since previsit or office visit. 

## 2023-04-05 NOTE — Op Note (Signed)
[facility] Patient Name: Jeffrey Liu Procedure Date: 04/05/2023 8:39 AM MRN: [id number] Endoscopist: Corliss Parish , MD, [id number] Age: 67 Referring MD:  Date of Birth: 08-01-1956 Gender: Male Account #: [id number] Procedure:                Colonoscopy Indications:              Surveillance: Personal history of adenomatous                            polyps on last colonoscopy 3 years ago Medicines:                Monitored Anesthesia Care Procedure:                Pre-Anesthesia Assessment:                           - Prior to the procedure, a History and Physical                            was performed, and patient medications and                            allergies were reviewed. The patient's tolerance of                            previous anesthesia was also reviewed. The risks                            and benefits of the procedure and the sedation                            options and risks were discussed with the patient.                            All questions were answered, and informed consent                            was obtained. Prior Anticoagulants: The patient has                            taken no anticoagulant or antiplatelet agents. ASA                            Grade Assessment: II - A patient with mild systemic                            disease. After reviewing the risks and benefits,                            the patient was deemed in satisfactory condition to                            undergo the procedure.  After obtaining informed consent, the colonoscope                            was passed under direct vision. Throughout the                            procedure, the patient's blood pressure, pulse, and                            oxygen saturations were monitored continuously. The                            Olympus CF-HQ190L ([id number]) Colonoscope was                            introduced through the  anus and advanced to the the                            cecum, identified by appendiceal orifice and                            ileocecal valve. The colonoscopy was somewhat                            difficult due to a redundant colon and significant                            looping. Successful completion of the procedure was                            aided by changing the patient's position, using                            manual pressure, straightening and shortening the                            scope to obtain bowel loop reduction and using                            scope torsion. The patient tolerated the procedure.                            The quality of the bowel preparation was adequate.                            The ileocecal valve, appendiceal orifice, and                            rectum were photographed. Scope In: 8:52:01 AM Scope Out: 9:15:17 AM Scope Withdrawal Time: 0 hours 14 minutes 51 seconds  Total Procedure Duration: 0 hours 23 minutes 16 seconds  Findings:                 The colon (entire examined portion) was grossly  redundant.                           Two sessile polyps were found in the transverse                            colon. The polyps were 2 to 3 mm in size. These                            polyps were removed with a cold snare. Resection                            and retrieval were complete.                           Normal mucosa was found in the entire colon                            otherwise.                           Non-bleeding non-thrombosed external and internal                            hemorrhoids were found during retroflexion, during                            perianal exam and during digital exam. The                            hemorrhoids were Grade II (internal hemorrhoids                            that prolapse but reduce spontaneously). Complications:            No immediate  complications. Estimated Blood Loss:     Estimated blood loss was minimal. Impression:               - Redundant colon.                           - Two 2 to 3 mm polyps in the transverse colon,                            removed with a cold snare. Resected and retrieved.                           - Normal mucosa in the entire examined colon                            otherwise.                           - Non-bleeding non-thrombosed external and internal  hemorrhoids. Recommendation:           - The patient will be observed post-procedure,                            until all discharge criteria are met.                           - Discharge patient to home.                           - Patient has a contact number available for                            emergencies. The signs and symptoms of potential                            delayed complications were discussed with the                            patient. Return to normal activities tomorrow.                            Written discharge instructions were provided to the                            patient.                           - High fiber diet.                           - Use FiberCon 1-2 tablets PO daily.                           - Continue present medications.                           - Await pathology results.                           - Repeat colonoscopy in 5-7 years for surveillance                            based on pathology and history of previous                            adenomatous.                           - The findings and recommendations were discussed                            with the patient.                           - The findings and recommendations were discussed  with the patient's family. Corliss Parish, MD 04/05/2023 9:20:03 AM

## 2023-04-05 NOTE — Patient Instructions (Signed)
Please read handouts provided. Continue present medications. Await pathology results. High Fiber Diet. Use FiberCon 1-2 tablets daily.   YOU HAD AN ENDOSCOPIC PROCEDURE TODAY AT THE [facility]:   Refer to the procedure report that was given to you for any specific questions about what was found during the examination.  If the procedure report does not answer your questions, please call your gastroenterologist to clarify.  If you requested that your care partner not be given the details of your procedure findings, then the procedure report has been included in a sealed envelope for you to review at your convenience later.  YOU SHOULD EXPECT: Some feelings of bloating in the abdomen. Passage of more gas than usual.  Walking can help get rid of the air that was put into your GI tract during the procedure and reduce the bloating. If you had a lower endoscopy (such as a colonoscopy or flexible sigmoidoscopy) you may notice spotting of blood in your stool or on the toilet paper. If you underwent a bowel prep for your procedure, you may not have a normal bowel movement for a few days.  Please Note:  You might notice some irritation and congestion in your nose or some drainage.  This is from the oxygen used during your procedure.  There is no need for concern and it should clear up in a day or so.  SYMPTOMS TO REPORT IMMEDIATELY:  Following lower endoscopy (colonoscopy or flexible sigmoidoscopy):  Excessive amounts of blood in the stool  Significant tenderness or worsening of abdominal pains  Swelling of the abdomen that is new, acute  Fever of 100F or higher   For urgent or emergent issues, a gastroenterologist can be reached at any hour by calling [phone]. Do not use MyChart messaging for urgent concerns.    DIET:  We do recommend a small meal at first, but then you may proceed to your regular diet.  Drink plenty of fluids but you should avoid alcoholic beverages for 24  hours.  ACTIVITY:  You should plan to take it easy for the rest of today and you should NOT DRIVE or use heavy machinery until tomorrow (because of the sedation medicines used during the test).    FOLLOW UP: Our staff will call the number listed on your records the next business day following your procedure.  We will call around 7:15- 8:00 am to check on you and address any questions or concerns that you may have regarding the information given to you following your procedure. If we do not reach you, we will leave a message.     If any biopsies were taken you will be contacted by phone or by letter within the next 1-3 weeks.  Please call us at [phone] if you have not heard about the biopsies in 3 weeks.    SIGNATURES/CONFIDENTIALITY: You and/or your care partner have signed paperwork which will be entered into your electronic medical record.  These signatures attest to the fact that that the information above on your After Visit Summary has been reviewed and is understood.  Full responsibility of the confidentiality of this discharge information lies with you and/or your care-partner. 

## 2023-04-05 NOTE — Progress Notes (Signed)
GASTROENTEROLOGY PROCEDURE H&P NOTE   Primary Care Physician: Kirby Funk, MD (Inactive)  HPI: Jeffrey Liu is a 67 y.o. male who presents for Colonoscopy for surveillance.  Past Medical History:  Diagnosis Date   Cancer ([facility])    skin cancers/melanoma and basal cell.   Depression    Glaucoma    Hyperlipidemia    Migraines    Past Surgical History:  Procedure Laterality Date   COLONOSCOPY  2015, 2020   phargeal diverticulum     Current Outpatient Medications  Medication Sig Dispense Refill   famotidine (PEPCID) 10 MG tablet Take 10 mg by mouth 2 (two) times daily.     FLUoxetine (PROZAC) 20 MG tablet Take 20 mg by mouth daily.     mometasone (ELOCON) 0.1 % cream Apply topically.     Multiple Vitamins-Minerals (MULTIVITAMIN ADULTS PO) Take by mouth.     rosuvastatin (CRESTOR) 10 MG tablet Take by mouth.     No current facility-administered medications for this visit.    Current Outpatient Medications:    famotidine (PEPCID) 10 MG tablet, Take 10 mg by mouth 2 (two) times daily., Disp: , Rfl:    FLUoxetine (PROZAC) 20 MG tablet, Take 20 mg by mouth daily., Disp: , Rfl:    mometasone (ELOCON) 0.1 % cream, Apply topically., Disp: , Rfl:    Multiple Vitamins-Minerals (MULTIVITAMIN ADULTS PO), Take by mouth., Disp: , Rfl:    rosuvastatin (CRESTOR) 10 MG tablet, Take by mouth., Disp: , Rfl:  No Known Allergies Family History  Problem Relation Age of Onset   Colon cancer Neg Hx    Colon polyps Neg Hx    Esophageal cancer Neg Hx    Rectal cancer Neg Hx    Stomach cancer Neg Hx    Social History   Socioeconomic History   Marital status: Married    Spouse name: Not on file   Number of children: Not on file   Years of education: Not on file   Highest education level: Not on file  Occupational History   Not on file  Tobacco Use   Smoking status: Never   Smokeless tobacco: Never  Vaping Use   Vaping Use: Never used  Substance and Sexual Activity   Alcohol use:  Yes    Comment: occ   Drug use: Never   Sexual activity: Not on file  Other Topics Concern   Not on file  Social History Narrative   Not on file   Social Determinants of Health   Financial Resource Strain: Not on file  Food Insecurity: Not on file  Transportation Needs: Not on file  Physical Activity: Not on file  Stress: Not on file  Social Connections: Not on file  Intimate Partner Violence: Not on file    Physical Exam: There were no vitals filed for this visit. There is no height or weight on file to calculate BMI. GEN: NAD EYE: Sclerae anicteric ENT: MMM CV: Non-tachycardic GI: Soft, NT/ND NEURO:  Alert & Oriented x 3  Lab Results: No results for input(s): "WBC", "HGB", "HCT", "PLT" in the last 72 hours. BMET No results for input(s): "NA", "K", "CL", "CO2", "GLUCOSE", "BUN", "CREATININE", "CALCIUM" in the last 72 hours. LFT No results for input(s): "PROT", "ALBUMIN", "AST", "ALT", "ALKPHOS", "BILITOT", "BILIDIR", "IBILI" in the last 72 hours. PT/INR No results for input(s): "LABPROT", "INR" in the last 72 hours.   Impression / Plan: This is a 67 y.o.male who presents for Colonoscopy for surveillance.  The risks  and benefits of endoscopic evaluation/treatment were discussed with the patient and/or family; these include but are not limited to the risk of perforation, infection, bleeding, missed lesions, lack of diagnosis, severe illness requiring hospitalization, as well as anesthesia and sedation related illnesses.  The patient's history has been reviewed, patient examined, no change in status, and deemed stable for procedure.  The patient and/or family is agreeable to proceed.    Corliss Parish, MD [facility] Advanced Endoscopy Office # [phone]

## 2023-04-06 ENCOUNTER — Telehealth: Payer: Self-pay | Admitting: *Deleted

## 2023-04-06 NOTE — Telephone Encounter (Signed)
  Follow up Call-    Row Labels 04/05/2023    8:11 AM  Call back number   Section Header. No data exists in this row.   Post procedure Call Back phone  #   [phone]  Permission to leave phone message   Yes     Patient questions:  Do you have a fever, pain , or abdominal swelling? No. Pain Score  0 *  Have you tolerated food without any problems? Yes.    Have you been able to return to your normal activities? Yes.    Do you have any questions about your discharge instructions: Diet   No. Medications  No. Follow up visit  No.  Do you have questions or concerns about your Care? No.  Actions: * If pain score is 4 or above: No action needed, pain <4.

## 2023-04-11 ENCOUNTER — Encounter: Payer: Self-pay | Admitting: Gastroenterology

## 2023-05-11 DIAGNOSIS — H5203 Hypermetropia, bilateral: Secondary | ICD-10-CM | POA: Diagnosis not present

## 2023-05-11 DIAGNOSIS — H2513 Age-related nuclear cataract, bilateral: Secondary | ICD-10-CM | POA: Diagnosis not present

## 2023-05-11 DIAGNOSIS — H401131 Primary open-angle glaucoma, bilateral, mild stage: Secondary | ICD-10-CM | POA: Diagnosis not present

## 2023-07-26 ENCOUNTER — Telehealth: Payer: Self-pay | Admitting: Gastroenterology

## 2023-07-26 DIAGNOSIS — E7800 Pure hypercholesterolemia, unspecified: Secondary | ICD-10-CM | POA: Diagnosis not present

## 2023-07-26 DIAGNOSIS — R002 Palpitations: Secondary | ICD-10-CM | POA: Diagnosis not present

## 2023-07-26 DIAGNOSIS — Z125 Encounter for screening for malignant neoplasm of prostate: Secondary | ICD-10-CM | POA: Diagnosis not present

## 2023-07-26 DIAGNOSIS — Z79899 Other long term (current) drug therapy: Secondary | ICD-10-CM | POA: Diagnosis not present

## 2023-07-26 NOTE — Telephone Encounter (Signed)
Jeffrey Liu with [facility] physicians is calling to have a copy of the report from his colonoscopy in June. Please send via fax to  [phone] attn: Dr. Orson Aloe

## 2023-07-26 NOTE — Telephone Encounter (Signed)
Report has been faxed as requested.

## 2023-07-31 ENCOUNTER — Encounter (HOSPITAL_COMMUNITY): Payer: Self-pay | Admitting: Internal Medicine

## 2023-08-01 ENCOUNTER — Other Ambulatory Visit (HOSPITAL_COMMUNITY): Payer: Self-pay | Admitting: Internal Medicine

## 2023-08-01 DIAGNOSIS — R002 Palpitations: Secondary | ICD-10-CM

## 2023-08-08 DIAGNOSIS — I351 Nonrheumatic aortic (valve) insufficiency: Secondary | ICD-10-CM

## 2023-08-08 DIAGNOSIS — I719 Aortic aneurysm of unspecified site, without rupture: Secondary | ICD-10-CM | POA: Insufficient documentation

## 2023-08-08 HISTORY — DX: Nonrheumatic aortic (valve) insufficiency: I35.1

## 2023-08-11 ENCOUNTER — Other Ambulatory Visit (HOSPITAL_COMMUNITY): Payer: Self-pay | Admitting: Internal Medicine

## 2023-08-11 DIAGNOSIS — E7800 Pure hypercholesterolemia, unspecified: Secondary | ICD-10-CM

## 2023-08-12 DIAGNOSIS — R002 Palpitations: Secondary | ICD-10-CM | POA: Diagnosis not present

## 2023-08-17 ENCOUNTER — Ambulatory Visit (HOSPITAL_COMMUNITY)
Admission: RE | Admit: 2023-08-17 | Discharge: 2023-08-17 | Disposition: A | Discharge status: Home / Self Care | Payer: Medicare HMO | Source: Ambulatory Visit | Attending: Internal Medicine | Admitting: Internal Medicine

## 2023-08-17 DIAGNOSIS — E7800 Pure hypercholesterolemia, unspecified: Secondary | ICD-10-CM

## 2023-08-21 DIAGNOSIS — F325 Major depressive disorder, single episode, in full remission: Secondary | ICD-10-CM | POA: Diagnosis not present

## 2023-08-21 DIAGNOSIS — Z881 Allergy status to other antibiotic agents status: Secondary | ICD-10-CM | POA: Diagnosis not present

## 2023-08-21 DIAGNOSIS — Z8249 Family history of ischemic heart disease and other diseases of the circulatory system: Secondary | ICD-10-CM | POA: Diagnosis not present

## 2023-08-21 DIAGNOSIS — Z85820 Personal history of malignant melanoma of skin: Secondary | ICD-10-CM | POA: Diagnosis not present

## 2023-08-21 DIAGNOSIS — K219 Gastro-esophageal reflux disease without esophagitis: Secondary | ICD-10-CM | POA: Diagnosis not present

## 2023-08-21 DIAGNOSIS — Z809 Family history of malignant neoplasm, unspecified: Secondary | ICD-10-CM | POA: Diagnosis not present

## 2023-08-21 DIAGNOSIS — H409 Unspecified glaucoma: Secondary | ICD-10-CM | POA: Diagnosis not present

## 2023-08-21 DIAGNOSIS — F419 Anxiety disorder, unspecified: Secondary | ICD-10-CM | POA: Diagnosis not present

## 2023-08-21 DIAGNOSIS — H269 Unspecified cataract: Secondary | ICD-10-CM | POA: Diagnosis not present

## 2023-08-21 DIAGNOSIS — E785 Hyperlipidemia, unspecified: Secondary | ICD-10-CM | POA: Diagnosis not present

## 2023-08-21 DIAGNOSIS — Z85828 Personal history of other malignant neoplasm of skin: Secondary | ICD-10-CM | POA: Diagnosis not present

## 2023-08-30 ENCOUNTER — Ambulatory Visit (HOSPITAL_COMMUNITY): Payer: Medicare HMO | Attending: Internal Medicine

## 2023-08-30 DIAGNOSIS — I7121 Aneurysm of the ascending aorta, without rupture: Secondary | ICD-10-CM | POA: Diagnosis not present

## 2023-08-30 DIAGNOSIS — R002 Palpitations: Secondary | ICD-10-CM | POA: Diagnosis not present

## 2023-08-30 DIAGNOSIS — E785 Hyperlipidemia, unspecified: Secondary | ICD-10-CM | POA: Diagnosis not present

## 2023-08-30 DIAGNOSIS — I080 Rheumatic disorders of both mitral and aortic valves: Secondary | ICD-10-CM | POA: Insufficient documentation

## 2023-08-30 LAB — ECHOCARDIOGRAM COMPLETE
Area-P 1/2: 2.99 cm2
P 1/2 time: 343 ms
S' Lateral: 3.6 cm

## 2023-09-01 ENCOUNTER — Other Ambulatory Visit (HOSPITAL_COMMUNITY): Payer: Self-pay | Admitting: Internal Medicine

## 2023-09-01 DIAGNOSIS — R931 Abnormal findings on diagnostic imaging of heart and coronary circulation: Secondary | ICD-10-CM

## 2023-09-01 DIAGNOSIS — I77819 Aortic ectasia, unspecified site: Secondary | ICD-10-CM

## 2023-09-02 ENCOUNTER — Ambulatory Visit (HOSPITAL_COMMUNITY)
Admission: RE | Admit: 2023-09-02 | Discharge: 2023-09-02 | Disposition: A | Discharge status: Home / Self Care | Payer: Medicare HMO | Source: Ambulatory Visit | Attending: Internal Medicine | Admitting: Internal Medicine

## 2023-09-02 DIAGNOSIS — I77819 Aortic ectasia, unspecified site: Secondary | ICD-10-CM | POA: Insufficient documentation

## 2023-09-02 DIAGNOSIS — R931 Abnormal findings on diagnostic imaging of heart and coronary circulation: Secondary | ICD-10-CM | POA: Diagnosis not present

## 2023-09-02 DIAGNOSIS — J9811 Atelectasis: Secondary | ICD-10-CM | POA: Diagnosis not present

## 2023-09-02 DIAGNOSIS — I7121 Aneurysm of the ascending aorta, without rupture: Secondary | ICD-10-CM | POA: Diagnosis not present

## 2023-09-02 DIAGNOSIS — I3139 Other pericardial effusion (noninflammatory): Secondary | ICD-10-CM | POA: Diagnosis not present

## 2023-09-02 DIAGNOSIS — K7689 Other specified diseases of liver: Secondary | ICD-10-CM | POA: Diagnosis not present

## 2023-09-02 MED ORDER — IOHEXOL 350 MG/ML SOLN
100.0000 mL | Freq: Once | INTRAVENOUS | Status: AC | PRN
  Administered 2023-09-02: 100 mL via INTRAVENOUS

## 2023-09-02 MED ORDER — SODIUM CHLORIDE (PF) 0.9 % IJ SOLN
INTRAMUSCULAR | Status: AC
  Filled 2023-09-02: qty 50

## 2023-09-04 ENCOUNTER — Telehealth: Payer: Self-pay | Admitting: Cardiology

## 2023-09-04 NOTE — Telephone Encounter (Signed)
Patient came into office to pick up disc.  I spoke with echo department and they will get this ready for patient.  He will need to sign a release. Greeter made aware and will have patient sign release

## 2023-09-04 NOTE — Telephone Encounter (Signed)
Patient is calling because he would like to pick up the media in office with the imaging from his Echo Results on 08/30/23. Patient stated he can sign medical release form if needed. Patient stated he spoken with medical records and they stated they only have the report not the images. Patient stated he would like to stop by at 11 AM today if possible to get the media disc. Please advise.

## 2023-09-04 NOTE — Telephone Encounter (Signed)
Patient came in and requested a copy of his echocardiogram results be put on a CD . Patient signed release of information and also received cd .

## 2023-09-06 ENCOUNTER — Ambulatory Visit: Payer: Medicare HMO | Admitting: Cardiology

## 2023-09-12 ENCOUNTER — Ambulatory Visit: Payer: Medicare HMO | Attending: Cardiology | Admitting: Cardiology

## 2023-09-12 ENCOUNTER — Encounter: Payer: Self-pay | Admitting: Cardiology

## 2023-09-12 ENCOUNTER — Other Ambulatory Visit: Payer: Self-pay | Admitting: Internal Medicine

## 2023-09-12 VITALS — BP 110/72 | HR 75 | Ht 76.0 in | Wt 185.0 lb

## 2023-09-12 DIAGNOSIS — I7121 Aneurysm of the ascending aorta, without rupture: Secondary | ICD-10-CM

## 2023-09-12 DIAGNOSIS — I77819 Aortic ectasia, unspecified site: Secondary | ICD-10-CM

## 2023-09-12 DIAGNOSIS — I351 Nonrheumatic aortic (valve) insufficiency: Secondary | ICD-10-CM

## 2023-09-12 DIAGNOSIS — R002 Palpitations: Secondary | ICD-10-CM

## 2023-09-12 HISTORY — DX: Aneurysm of the ascending aorta, without rupture: I71.21

## 2023-09-12 MED ORDER — METOPROLOL SUCCINATE ER 25 MG PO TB24: 25.0000 mg | ORAL_TABLET | Freq: Every day | ORAL | 3 refills | Status: DC

## 2023-09-12 NOTE — Patient Instructions (Signed)
Medication Instructions:  Please start Metoprolol Succinate 25 mg a day. Continue all other medications as listed.  *If you need a refill on your cardiac medications before your next appointment, please call your pharmacy*  Follow-Up: At [facility], you and your health needs are our priority.  As part of our continuing mission to provide you with exceptional heart care, we have created designated Provider Care Teams.  These Care Teams include your primary Cardiologist (physician) and Advanced Practice Providers (APPs -  Physician Assistants and Nurse Practitioners) who all work together to provide you with the care you need, when you need it.  We recommend signing up for the patient portal called "MyChart".  Sign up information is provided on this After Visit Summary.  MyChart is used to connect with patients for Virtual Visits (Telemedicine).  Patients are able to view lab/test results, encounter notes, upcoming appointments, etc.  Non-urgent messages can be sent to your provider as well.   To learn more about what you can do with MyChart, go to [email or web address].    Your next appointment:   2 month(s)  Provider:   Dr Donato Schultz

## 2023-09-13 NOTE — Progress Notes (Signed)
Cardiology Office Note:  .   Date:  09/13/2023  ID:  Elpidio Galea, MD, DOB 07-26-1956, MRN [id number] PCP: Kirby Funk, MD (Inactive)  [facility] Providers Cardiologist:  None     History of Present Illness: .   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 Recent echocardiogram and CT scan results indicate a normal ejection fraction of 65%, mild mitral valve regurgitation, and moderate to severe aortic valve regurgitation with an eccentric jet along the anterior mitral valve leaflet. The aortic root was measured at 47mm and the ascending aorta at 45-74mm.  The patient reports no limitations in physical activity, engaging in regular indoor biking and weightlifting. However, he recalls an instance of needing to catch his breath while hauling items uphill in his backyard. He also reports a history of left ankle swelling, initially attributed to venous insufficiency, which has been present for approximately 8-9 years. During a trip to [place] in 2019, the patient experienced severe chain stokes breathing, though it is unclear if this is related to his current cardiac condition.  The patient has a history of migraines, which have been less frequent but previously associated with episodes of frequent vomiting and retching. During these episodes, he recalls experiencing sharp, fleeting chest pain. He also reports a history of a pharyngeal diverticulum repair in 2002.  The patient has been proactive in managing his health, maintaining a good diet and exercise routine. He has been on rosuvastatin twice a week, with a recent LDL level of 62. He also started taking baby aspirin three weeks ago. The patient's coronary calcium score was reported as zero.      Personally reviewed his  echocardiogram, showed him images.    Studies Reviewed: Marland Kitchen   EKG Interpretation Date/Time:  Tuesday September 12 2023 14:39:54 EST Ventricular Rate:  66 PR Interval:  188 QRS Duration:  100 QT Interval:  440 QTC Calculation: 461 R Axis:   15  Text Interpretation: Normal sinus rhythm Nonspecific ST abnormality No previous ECGs available Confirmed by Donato Schultz ([id number]) on 09/12/2023 3:02:02 PM    Results LABS Hb: 14.7 LDL: 62 HDL: 50  RADIOLOGY Chest CT: Aortic root 47 mm, ascending aorta 45 mm (09/02/2023)  DIAGNOSTIC Echocardiogram: Normal ejection fraction 65%, mild mitral valve regurgitation, moderate to severe aortic valve regurgitation with eccentric jet along the anterior mitral valve leaflet, aortic root 47 mm, ascending aorta 46 mm (08/30/2023)  Risk Assessment/Calculations:            Physical Exam:   VS:  BP 110/72   Pulse 75   Ht 6\' 4"  (1.93 m)   Wt 185 lb (83.9 kg)   SpO2 96%   BMI 22.52 kg/m    Wt Readings from Last 3 Encounters:  09/12/23 185 lb (83.9 kg)  04/05/23 185 lb (83.9 kg)  03/06/23 185 lb (83.9 kg)    GEN: Well nourished, well developed in no acute distress NECK: No JVD; No carotid bruits CARDIAC: RRR, no murmurs( perhaps soft diastolic murmur at apex) no rubs, no gallops RESPIRATORY:  Clear to auscultation without rales, wheezing or rhonchi  ABDOMEN: Soft, non-tender, non-distended EXTREMITIES:  No edema; No deformity   ASSESSMENT AND PLAN: .    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 Echocardiogram on 08/30/2023 showed normal ejection fraction (65%), mild mitral valve regurgitation, and moderate to severe aortic valve regurgitation with an eccentric jet along the anterior mitral valve leaflet. Aortic root measured at 47 mm and ascending aorta at 46 mm. CT scan on 09/20/2023 confirmed these measurements. Surgery scheduled for  valve repair on 10/09/2023 at the [facility] with Dr. Cristal Ford. Risks include infection, bleeding, and potential need for valve replacement. Benefits include improved cardiac function and prevention of aortic dissection. High success rates noted at the [facility]. Advised to avoid extreme Valsalva maneuvers and heavy lifting. Migraines and occasional ankle swelling noted, likely unrelated. - Start Toprol XL 25 mg daily - Reasonable to stop ASA 81mg  given lack of coronary artery calcification, however soft plaque may be present. Continue continuing baby aspirin until 5 days before surgery - Order esophagram to ensure patency for intraoperative TEE (Ordered by Dr. Orson Aloe) - Schedule follow-up visit 6 weeks post-surgery for cardiac rehab assessment  General Health Maintenance On rosuvastatin 10 mg twice a week for cholesterol management. LDL is 62, HDL is 50, and hemoglobin is 14.7. Maintains a good diet and exercise routine. - Continue rosuvastatin 10 mg twice a week - Maintain current diet and exercise regimen  Follow-up - Schedule follow-up visit 6 weeks post-surgery for cardiac rehab assessment.               Signed, Donato Schultz, MD

## 2023-09-14 ENCOUNTER — Other Ambulatory Visit: Payer: Self-pay | Admitting: Internal Medicine

## 2023-09-14 ENCOUNTER — Ambulatory Visit
Admission: RE | Admit: 2023-09-14 | Discharge: 2023-09-14 | Disposition: A | Discharge status: Home / Self Care | Payer: Medicare HMO | Source: Ambulatory Visit | Attending: Internal Medicine | Admitting: Internal Medicine

## 2023-09-14 DIAGNOSIS — I77819 Aortic ectasia, unspecified site: Secondary | ICD-10-CM

## 2023-09-14 DIAGNOSIS — I712 Thoracic aortic aneurysm, without rupture, unspecified: Secondary | ICD-10-CM | POA: Diagnosis not present

## 2023-09-14 DIAGNOSIS — Z01818 Encounter for other preprocedural examination: Secondary | ICD-10-CM | POA: Diagnosis not present

## 2023-09-26 DIAGNOSIS — R972 Elevated prostate specific antigen [PSA]: Secondary | ICD-10-CM | POA: Diagnosis not present

## 2023-10-04 DIAGNOSIS — I440 Atrioventricular block, first degree: Secondary | ICD-10-CM | POA: Diagnosis not present

## 2023-10-04 DIAGNOSIS — I351 Nonrheumatic aortic (valve) insufficiency: Secondary | ICD-10-CM | POA: Diagnosis not present

## 2023-10-04 DIAGNOSIS — I359 Nonrheumatic aortic valve disorder, unspecified: Secondary | ICD-10-CM | POA: Diagnosis not present

## 2023-10-04 DIAGNOSIS — R001 Bradycardia, unspecified: Secondary | ICD-10-CM | POA: Diagnosis not present

## 2023-10-04 DIAGNOSIS — R9431 Abnormal electrocardiogram [ECG] [EKG]: Secondary | ICD-10-CM | POA: Diagnosis not present

## 2023-10-04 DIAGNOSIS — I7121 Aneurysm of the ascending aorta, without rupture: Secondary | ICD-10-CM | POA: Diagnosis not present

## 2023-10-04 DIAGNOSIS — I454 Nonspecific intraventricular block: Secondary | ICD-10-CM | POA: Diagnosis not present

## 2023-10-04 DIAGNOSIS — Z01810 Encounter for preprocedural cardiovascular examination: Secondary | ICD-10-CM | POA: Diagnosis not present

## 2023-10-05 DIAGNOSIS — I351 Nonrheumatic aortic (valve) insufficiency: Secondary | ICD-10-CM | POA: Diagnosis not present

## 2023-10-05 DIAGNOSIS — G43909 Migraine, unspecified, not intractable, without status migrainosus: Secondary | ICD-10-CM | POA: Diagnosis not present

## 2023-10-05 DIAGNOSIS — I7121 Aneurysm of the ascending aorta, without rupture: Secondary | ICD-10-CM | POA: Diagnosis not present

## 2023-10-05 DIAGNOSIS — Z01818 Encounter for other preprocedural examination: Secondary | ICD-10-CM | POA: Insufficient documentation

## 2023-10-05 DIAGNOSIS — Z01810 Encounter for preprocedural cardiovascular examination: Secondary | ICD-10-CM | POA: Diagnosis not present

## 2023-10-05 DIAGNOSIS — E785 Hyperlipidemia, unspecified: Secondary | ICD-10-CM | POA: Diagnosis not present

## 2023-10-09 DIAGNOSIS — D62 Acute posthemorrhagic anemia: Secondary | ICD-10-CM | POA: Insufficient documentation

## 2023-10-09 DIAGNOSIS — E8770 Fluid overload, unspecified: Secondary | ICD-10-CM | POA: Diagnosis not present

## 2023-10-09 DIAGNOSIS — E785 Hyperlipidemia, unspecified: Secondary | ICD-10-CM | POA: Diagnosis not present

## 2023-10-09 DIAGNOSIS — I77810 Thoracic aortic ectasia: Secondary | ICD-10-CM | POA: Diagnosis not present

## 2023-10-09 DIAGNOSIS — I480 Paroxysmal atrial fibrillation: Secondary | ICD-10-CM | POA: Diagnosis not present

## 2023-10-09 DIAGNOSIS — F419 Anxiety disorder, unspecified: Secondary | ICD-10-CM | POA: Diagnosis not present

## 2023-10-09 DIAGNOSIS — R001 Bradycardia, unspecified: Secondary | ICD-10-CM | POA: Diagnosis not present

## 2023-10-09 DIAGNOSIS — R9431 Abnormal electrocardiogram [ECG] [EKG]: Secondary | ICD-10-CM | POA: Diagnosis not present

## 2023-10-09 DIAGNOSIS — D696 Thrombocytopenia, unspecified: Secondary | ICD-10-CM | POA: Diagnosis not present

## 2023-10-09 DIAGNOSIS — J95811 Postprocedural pneumothorax: Secondary | ICD-10-CM | POA: Diagnosis not present

## 2023-10-09 DIAGNOSIS — Z8679 Personal history of other diseases of the circulatory system: Secondary | ICD-10-CM | POA: Insufficient documentation

## 2023-10-09 DIAGNOSIS — J90 Pleural effusion, not elsewhere classified: Secondary | ICD-10-CM | POA: Diagnosis not present

## 2023-10-09 DIAGNOSIS — H409 Unspecified glaucoma: Secondary | ICD-10-CM | POA: Diagnosis not present

## 2023-10-09 DIAGNOSIS — I97190 Other postprocedural cardiac functional disturbances following cardiac surgery: Secondary | ICD-10-CM | POA: Diagnosis not present

## 2023-10-09 DIAGNOSIS — I440 Atrioventricular block, first degree: Secondary | ICD-10-CM | POA: Diagnosis not present

## 2023-10-09 DIAGNOSIS — I973 Postprocedural hypertension: Secondary | ICD-10-CM | POA: Diagnosis not present

## 2023-10-09 DIAGNOSIS — I729 Aneurysm of unspecified site: Secondary | ICD-10-CM | POA: Diagnosis not present

## 2023-10-09 DIAGNOSIS — I351 Nonrheumatic aortic (valve) insufficiency: Secondary | ICD-10-CM | POA: Diagnosis not present

## 2023-10-09 DIAGNOSIS — Z9889 Other specified postprocedural states: Secondary | ICD-10-CM | POA: Diagnosis not present

## 2023-10-09 DIAGNOSIS — H5702 Anisocoria: Secondary | ICD-10-CM | POA: Diagnosis not present

## 2023-10-09 DIAGNOSIS — I7121 Aneurysm of the ascending aorta, without rupture: Secondary | ICD-10-CM | POA: Diagnosis not present

## 2023-10-09 DIAGNOSIS — G8918 Other acute postprocedural pain: Secondary | ICD-10-CM | POA: Insufficient documentation

## 2023-10-09 DIAGNOSIS — I4891 Unspecified atrial fibrillation: Secondary | ICD-10-CM | POA: Diagnosis not present

## 2023-10-09 DIAGNOSIS — H538 Other visual disturbances: Secondary | ICD-10-CM | POA: Diagnosis not present

## 2023-10-09 DIAGNOSIS — J9811 Atelectasis: Secondary | ICD-10-CM | POA: Diagnosis not present

## 2023-10-09 DIAGNOSIS — F32A Depression, unspecified: Secondary | ICD-10-CM | POA: Diagnosis not present

## 2023-10-09 DIAGNOSIS — J939 Pneumothorax, unspecified: Secondary | ICD-10-CM | POA: Diagnosis not present

## 2023-10-09 DIAGNOSIS — K219 Gastro-esophageal reflux disease without esophagitis: Secondary | ICD-10-CM | POA: Diagnosis not present

## 2023-10-09 DIAGNOSIS — E861 Hypovolemia: Secondary | ICD-10-CM | POA: Diagnosis not present

## 2023-10-09 DIAGNOSIS — R739 Hyperglycemia, unspecified: Secondary | ICD-10-CM | POA: Insufficient documentation

## 2023-10-09 DIAGNOSIS — I712 Thoracic aortic aneurysm, without rupture, unspecified: Secondary | ICD-10-CM | POA: Diagnosis not present

## 2023-10-09 DIAGNOSIS — I4819 Other persistent atrial fibrillation: Secondary | ICD-10-CM | POA: Diagnosis not present

## 2023-10-09 DIAGNOSIS — I3139 Other pericardial effusion (noninflammatory): Secondary | ICD-10-CM | POA: Diagnosis not present

## 2023-10-09 DIAGNOSIS — H5310 Unspecified subjective visual disturbances: Secondary | ICD-10-CM | POA: Diagnosis not present

## 2023-10-09 DIAGNOSIS — I4892 Unspecified atrial flutter: Secondary | ICD-10-CM | POA: Diagnosis not present

## 2023-10-09 DIAGNOSIS — H5340 Unspecified visual field defects: Secondary | ICD-10-CM | POA: Diagnosis not present

## 2023-10-09 DIAGNOSIS — Q2543 Congenital aneurysm of aorta: Secondary | ICD-10-CM | POA: Diagnosis not present

## 2023-10-09 DIAGNOSIS — E872 Acidosis, unspecified: Secondary | ICD-10-CM | POA: Diagnosis not present

## 2023-10-10 DIAGNOSIS — E8770 Fluid overload, unspecified: Secondary | ICD-10-CM | POA: Insufficient documentation

## 2023-10-10 DIAGNOSIS — J9811 Atelectasis: Secondary | ICD-10-CM | POA: Insufficient documentation

## 2023-10-11 DIAGNOSIS — I4892 Unspecified atrial flutter: Secondary | ICD-10-CM | POA: Insufficient documentation

## 2023-10-11 DIAGNOSIS — I4891 Unspecified atrial fibrillation: Secondary | ICD-10-CM | POA: Insufficient documentation

## 2023-10-11 DIAGNOSIS — H5702 Anisocoria: Secondary | ICD-10-CM | POA: Insufficient documentation

## 2023-10-14 DIAGNOSIS — F32A Depression, unspecified: Secondary | ICD-10-CM

## 2023-10-14 DIAGNOSIS — K219 Gastro-esophageal reflux disease without esophagitis: Secondary | ICD-10-CM | POA: Insufficient documentation

## 2023-10-14 DIAGNOSIS — H539 Unspecified visual disturbance: Secondary | ICD-10-CM | POA: Insufficient documentation

## 2023-10-14 DIAGNOSIS — E785 Hyperlipidemia, unspecified: Secondary | ICD-10-CM | POA: Insufficient documentation

## 2023-10-14 DIAGNOSIS — F419 Anxiety disorder, unspecified: Secondary | ICD-10-CM | POA: Insufficient documentation

## 2023-10-14 DIAGNOSIS — J95811 Postprocedural pneumothorax: Secondary | ICD-10-CM | POA: Insufficient documentation

## 2023-10-14 DIAGNOSIS — Z8669 Personal history of other diseases of the nervous system and sense organs: Secondary | ICD-10-CM | POA: Insufficient documentation

## 2023-10-14 HISTORY — DX: Depression, unspecified: F32.A

## 2023-10-14 HISTORY — DX: Gastro-esophageal reflux disease without esophagitis: K21.9

## 2023-10-17 ENCOUNTER — Telehealth: Payer: Self-pay | Admitting: Cardiology

## 2023-10-17 NOTE — Telephone Encounter (Signed)
Pt would like to discuss anticoagulation with Dr Anne Fu.  Surgeon wants him to take coumadin but said he would leave the decision up  to Dr Anne Fu. Pt would prefer to take Eliquis.  D/c summary in CareEverywhere (per pt report) now if Dr Anne Fu would like to review it.  Advised I will send this information to Dr Anne Fu for review and to call pt back when possible.  He was applicative of the call back and information.  He looks forward to speaking with Dr Anne Fu.

## 2023-10-17 NOTE — Telephone Encounter (Signed)
New Message:     Patient says he is in the hospital in [place].  He said if possible he would like for Dr Anne Fu to call him this morning please. He said if Dr Caro Hight can not call, please have his nurse to call him. He said it is important that he talked to one of them this morning. He says he needs to make some decisions about his medicine.

## 2023-10-17 NOTE — Telephone Encounter (Signed)
Patient states that he is returning call. Requesting call back.

## 2023-10-18 ENCOUNTER — Encounter: Payer: Self-pay | Admitting: Cardiology

## 2023-10-18 MED ORDER — APIXABAN 5 MG PO TABS: 5.0000 mg | ORAL_TABLET | Freq: Two times a day (BID) | ORAL | 1 refills | Status: DC

## 2023-10-18 NOTE — Telephone Encounter (Signed)
Dr Anne Fu is aware of this information and stated he will call pt to discuss.

## 2023-10-18 NOTE — Telephone Encounter (Signed)
Prescription for Eliquis 5 mg one PO BID #60 x 1 has been called into [facility] [phone] as requested by pt and ordered by Dr Anne Fu.

## 2023-10-19 DIAGNOSIS — Z95828 Presence of other vascular implants and grafts: Secondary | ICD-10-CM | POA: Diagnosis not present

## 2023-10-19 DIAGNOSIS — R9431 Abnormal electrocardiogram [ECG] [EKG]: Secondary | ICD-10-CM | POA: Diagnosis not present

## 2023-10-19 DIAGNOSIS — R001 Bradycardia, unspecified: Secondary | ICD-10-CM | POA: Diagnosis not present

## 2023-10-19 DIAGNOSIS — Z09 Encounter for follow-up examination after completed treatment for conditions other than malignant neoplasm: Secondary | ICD-10-CM | POA: Diagnosis not present

## 2023-10-19 DIAGNOSIS — R079 Chest pain, unspecified: Secondary | ICD-10-CM | POA: Diagnosis not present

## 2023-10-19 DIAGNOSIS — I483 Typical atrial flutter: Secondary | ICD-10-CM | POA: Diagnosis not present

## 2023-10-19 DIAGNOSIS — I440 Atrioventricular block, first degree: Secondary | ICD-10-CM | POA: Diagnosis not present

## 2023-10-19 DIAGNOSIS — J95811 Postprocedural pneumothorax: Secondary | ICD-10-CM | POA: Diagnosis not present

## 2023-10-19 DIAGNOSIS — Z952 Presence of prosthetic heart valve: Secondary | ICD-10-CM | POA: Diagnosis not present

## 2023-10-22 DIAGNOSIS — R69 Illness, unspecified: Secondary | ICD-10-CM | POA: Diagnosis not present

## 2023-10-23 DIAGNOSIS — R69 Illness, unspecified: Secondary | ICD-10-CM | POA: Diagnosis not present

## 2023-10-26 ENCOUNTER — Encounter: Payer: Self-pay | Admitting: Cardiology

## 2023-10-30 ENCOUNTER — Telehealth: Payer: Self-pay

## 2023-10-30 DIAGNOSIS — I7121 Aneurysm of the ascending aorta, without rupture: Secondary | ICD-10-CM

## 2023-10-30 DIAGNOSIS — R059 Cough, unspecified: Secondary | ICD-10-CM

## 2023-10-30 NOTE — Telephone Encounter (Signed)
Call to Dr. Lahoma Rocker to verify when amiodarone was started.   Dr. Lahoma Rocker advises he was started on IV amio 10/10/23, the day after his procedure due to runs of afib. He was transitioned to PO amio 10/18/23.   On 10/26/23 he wrote in to say he had an episode of afib and was advised to take amio 200 mg BID for 3 days started 10/26/23.   Dr. Lahoma Rocker advises he did so and is now back on his normal dose of 200 mg amio daily. He is also taking his prescribed toprol 25 mg daily. However, he has had several long episodes of afib since 10/26/23. Last night he was alerted by palpitations as well as his apple watch that he was in afib again. He reports his HR's have been less than 100 and he has only experienced exertional SOB-which he states he has been experiencing since his procedure. He reports his O2 sats are satisfactory at >93%.   Made patient an appt for a nurse visit EKG tomorrow 10/31/23 at 11 AM. Also found a cancellation for Dr. Anne Fu on 11/05/22 at 3:20, patient confirms he is able to make this appointment.

## 2023-10-30 NOTE — Telephone Encounter (Signed)
Patient writing in on MyChart with recent sternotomy and concern for cough. Patient writes "I've developed a non-productive cough over the past week - no fever/chills but a moderate amount of pain associated with the sternotomy when I cough. No sore throat or nasal congestion. 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 Should I be concerned?"  Discussed w/ Dr. Nelly Laurence, who requests to verify when Dr. Lahoma Rocker started [facility] and what dose he is currently on. Phone call placed, left message per DPR asking patient to check MyChart messages or call our office. Also ordered stat CXR per Dr. Nelly Laurence, advised patient of this via voice mail and MyChart message.

## 2023-10-31 ENCOUNTER — Ambulatory Visit: Payer: Medicare HMO | Attending: Cardiology

## 2023-10-31 VITALS — BP 118/68 | HR 50 | Wt 191.8 lb

## 2023-10-31 DIAGNOSIS — I4892 Unspecified atrial flutter: Secondary | ICD-10-CM

## 2023-10-31 NOTE — Patient Instructions (Addendum)
 Follow-Up: At [facility], you and your health needs are our priority.  As part of our continuing mission to provide you with exceptional heart care, we have created designated Provider Care Teams.  These Care Teams include your primary Cardiologist (physician) and Advanced Practice Providers (APPs -  Physician Assistants and Nurse Practitioners) who all work together to provide you with the care you need, when you need it.  Your next appointment:   As scheduled  Provider:   Oneil Parchment, MD

## 2023-10-31 NOTE — Progress Notes (Signed)
   Nurse Visit   Date of Encounter: 10/31/2023 ID: Wolm Marcus, MD, DOB [birth year], MRN [id number]  PCP:  Signa Rush, MD (Inactive)   [facility] Providers Cardiologist:  None      Visit Details   VS:  BP 118/68 (BP Location: Left Arm, Patient Position: Sitting, Cuff Size: Normal)   Pulse (!) 50   Wt 191 lb 12.8 oz (87 kg)   SpO2 97%   BMI 23.35 kg/m  , BMI Body mass index is 23.35 kg/m.  Wt Readings from Last 3 Encounters:  10/31/23 191 lb 12.8 oz (87 kg)  09/12/23 185 lb (83.9 kg)  04/05/23 185 lb (83.9 kg)     Reason for visit: EKG Performed today: Vitals, EKG, Provider consulted:Lambert, and Education Changes (medications, testing, etc.) : NONE Length of Visit: 20 minutes    Medications Adjustments/Labs and Tests Ordered: Orders Placed This Encounter  Procedures   EKG 12-Lead   No orders of the defined types were placed in this encounter.  Patient had breakthrough episode of A-fib and has been recording rhythm strips via apple watch. Wrote into office and advised to increase Amio 200mg  twice daily for 3 days by Dr Jeffrie, then return to once daily dosing and cme in for EKG to confirm if back in SR. Pt has readings on his watch from 3:44am yesterday which show HR in the mid 30's. He states his baseline is about 50bpm and it came up with activity. Advised he keep monitoring. He expresses a dry, non productive cough that is bothersome only during daytime. Seems to resolve when laying flat. CXR has been ordered for him, asked that he stop by [facility] to complete that since he had post-op pneumothorax w/chest tube insertion. He denies using spirometer at home;encouraged him to do so. No further complaints.   Signed, Lauraine MARLA Bonus, RN  10/31/2023 11:47 AM

## 2023-11-06 ENCOUNTER — Ambulatory Visit: Payer: Medicare HMO | Attending: Cardiology | Admitting: Cardiology

## 2023-11-06 ENCOUNTER — Encounter: Payer: Self-pay | Admitting: Cardiology

## 2023-11-06 VITALS — BP 118/64 | HR 51 | Ht 76.0 in | Wt 187.8 lb

## 2023-11-06 DIAGNOSIS — D2261 Melanocytic nevi of right upper limb, including shoulder: Secondary | ICD-10-CM | POA: Diagnosis not present

## 2023-11-06 DIAGNOSIS — L905 Scar conditions and fibrosis of skin: Secondary | ICD-10-CM | POA: Diagnosis not present

## 2023-11-06 DIAGNOSIS — I4892 Unspecified atrial flutter: Secondary | ICD-10-CM | POA: Diagnosis not present

## 2023-11-06 DIAGNOSIS — Z85828 Personal history of other malignant neoplasm of skin: Secondary | ICD-10-CM | POA: Diagnosis not present

## 2023-11-06 DIAGNOSIS — D2262 Melanocytic nevi of left upper limb, including shoulder: Secondary | ICD-10-CM | POA: Diagnosis not present

## 2023-11-06 DIAGNOSIS — D2271 Melanocytic nevi of right lower limb, including hip: Secondary | ICD-10-CM | POA: Diagnosis not present

## 2023-11-06 DIAGNOSIS — L821 Other seborrheic keratosis: Secondary | ICD-10-CM | POA: Diagnosis not present

## 2023-11-06 DIAGNOSIS — Z9889 Other specified postprocedural states: Secondary | ICD-10-CM

## 2023-11-06 DIAGNOSIS — D225 Melanocytic nevi of trunk: Secondary | ICD-10-CM | POA: Diagnosis not present

## 2023-11-06 DIAGNOSIS — D2272 Melanocytic nevi of left lower limb, including hip: Secondary | ICD-10-CM | POA: Diagnosis not present

## 2023-11-06 MED ORDER — APIXABAN 5 MG PO TABS: 5.0000 mg | ORAL_TABLET | Freq: Two times a day (BID) | ORAL | 4 refills | Status: DC

## 2023-11-06 NOTE — Patient Instructions (Signed)
 Medication Instructions:  The current medical regimen is effective;  continue present plan and medications.  *If you need a refill on your cardiac medications before your next appointment, please call your pharmacy*  You have been referred to Cardiac Rehab and will be contacted to be scheduled.  Follow-Up: At [facility], you and your health needs are our priority.  As part of our continuing mission to provide you with exceptional heart care, we have created designated Provider Care Teams.  These Care Teams include your primary Cardiologist (physician) and Advanced Practice Providers (APPs -  Physician Assistants and Nurse Practitioners) who all work together to provide you with the care you need, when you need it.  We recommend signing up for the patient portal called MyChart.  Sign up information is provided on this After Visit Summary.  MyChart is used to connect with patients for Virtual Visits (Telemedicine).  Patients are able to view lab/test results, encounter notes, upcoming appointments, etc.  Non-urgent messages can be sent to your provider as well.   To learn more about what you can do with MyChart, go to [email or web address].    Your next appointment:   2 month(s)  Provider:   Dr Oneil Parchment

## 2023-11-06 NOTE — Progress Notes (Signed)
 Cardiology Office Note:  .   Date:  11/06/2023  ID:  Jeffrey Marcus, MD, DOB 12-17-1955, MRN [id number] PCP: Signa Rush, MD (Inactive)  [facility] Providers Cardiologist:  Oneil Parchment, MD    History of Present Illness: .   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 Postoperatively, he developed atrial fibrillation, which was managed with amiodarone. However, due to bradycardia with heart rates in the 30s to 40s at night, the amiodarone was discontinued. The patient also reported orthostatic changes. His metoprolol  was reduced to 12.5 mg once a day.  Postoperatively, the patient experienced a right pneumothorax, which was managed with a pigtail catheter. A follow-up chest x-ray showed a trace right apical lateral pneumothorax. The patient also reported a dry cough for a couple of weeks and a clicking sensation in the chest.  The patient has been taking Eliquis  5 mg twice a day, aspirin 81 mg a day, Lopressor  12.5 mg in the morning, and Crestor 10 mg twice a week. He reported some swelling, which he attributes to venous insufficiency.  The patient also reported visual hallucinations during his ICU stay, which he attributes to sleep deprivation. He has been managing these symptoms with albuterol and deep breathing exercises.           Studies Reviewed: SABRA   EKG Interpretation Date/Time:  Monday November 06 2023 16:01:05 EST Ventricular Rate:  51 PR Interval:  184 QRS Duration:  100 QT Interval:  534 QTC Calculation: 492 R Axis:   45  Text Interpretation: Sinus bradycardia Non-specific ST-t changes (TWI V1-2) When compared with ECG of 31-Oct-2023 11:23, No significant change was found Confirmed by Parchment Oneil ([id number]) on 11/06/2023 4:05:27 PM    Results   RADIOLOGY Chest x-ray: Trace  right apical lateral pneumothorax (10/19/2023) Gated CT: Normal postoperative findings  DIAGNOSTIC Cardiac catheterization: No significant findings  PATHOLOGY Medial degeneration (10/09/2023)     Risk Assessment/Calculations:            Physical Exam:   VS:  BP 118/64   Pulse (!) 51   Ht 6' 4 (1.93 m)   Wt 187 lb 12.8 oz (85.2 kg)   SpO2 99%   BMI 22.86 kg/m    Wt Readings from Last 3 Encounters:  11/06/23 187 lb 12.8 oz (85.2 kg)  10/31/23 191 lb 12.8 oz (87 kg)  09/12/23 185 lb (83.9 kg)    GEN: Well nourished, well developed in no acute distress NECK: No JVD; No carotid bruits CARDIAC: brady reg, no murmurs, no rubs, no gallops RESPIRATORY:  Clear to auscultation without rales, wheezing or rhonchi  ABDOMEN: Soft, non-tender, non-distended EXTREMITIES:  mild edema; No deformity   ASSESSMENT AND PLAN: .    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 Heart rates were in the 30s-40s at night and 50s-60s during the day, with orthostatic symptoms. Amiodarone was stopped due to bradycardia, and metoprolol  was reduced to 12.5 mg daily. Currently on Eliquis  5 mg twice daily and aspirin 81 mg daily. Discussed the typical postoperative course and the need for continued monitoring. Amiodarone will remain in the system for a while, aiding arrhythmia control. Plan to evaluate potential discontinuation of Eliquis  in  a few months with Zio monitor results. - Continue Eliquis  5 mg twice daily - Continue aspirin 81 mg daily - Continue metoprolol  12.5 mg daily - Refill Eliquis  prescription - Evaluate potential discontinuation of Eliquis  in a few months with Zio monitor results  Postoperative Bradycardia Bradycardia with heart rates in the 30s-40s at night and 50s-60s during the day, likely secondary to amiodarone.  Amiodarone was stopped, and metoprolol  was reduced. Discussed the risks of bradycardia and benefits of stopping amiodarone. - Monitor heart rate and symptoms - Continue reduced dose of metoprolol  12.5 mg daily  Postoperative Pneumothorax Right postoperative pneumothorax managed with a pigtail catheter. Follow-up chest x-ray on 12/19 showed trace right apical lateral pneumothorax. Discussed typical recovery course and low likelihood of recurrence. 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 Reports being winded, reduced stamina, and orthostatic dizziness. Fluid retention noted, possibly due to venous insufficiency. Discussed typical postoperative recovery timeline, including fluctuations in stamina and importance of gradual activity increase. Encouraged walking and light activity to improve stamina. Discussed potential need for further investigation if fluid retention persists. - Encourage walking and light activity - Start cardiac rehab in two weeks - Monitor for fluid retention and consider further investigation if symptoms persist  Postoperative Cough Persistent dry cough for a couple of weeks, likely related to postoperative changes. Using albuterol inhaler and mucolytics. Discussed commonality of postoperative cough and use of deep breathing exercises to aid recovery. - Continue using albuterol inhaler - Continue mucolytics (guaifenesin) - Encourage deep breathing exercises  General Health Maintenance Discussed importance of cardiac rehab and monitoring for postoperative complications. Emphasized need for continued follow-up and adherence to medication regimen. - Start cardiac rehab in two weeks - Schedule follow-up appointment in two months  Follow-up - Schedule follow-up appointment in two months - Ensure Zio patch results are reviewed - Monitor  for any new or worsening symptoms.      45 minutes spent reviewing [facility] clinic op notes, clinic notes, diagnostic studies, patient messages.      Cardiac Rehabilitation Eligibility Assessment  The patient is ready to start cardiac rehabilitation from a cardiac standpoint.        Signed, Oneil Parchment, MD

## 2023-11-10 ENCOUNTER — Ambulatory Visit
Admission: RE | Admit: 2023-11-10 | Discharge: 2023-11-10 | Disposition: A | Discharge status: Home / Self Care | Payer: Medicare HMO | Source: Ambulatory Visit | Attending: Cardiovascular Disease

## 2023-11-10 DIAGNOSIS — I7121 Aneurysm of the ascending aorta, without rupture: Secondary | ICD-10-CM

## 2023-11-10 DIAGNOSIS — R059 Cough, unspecified: Secondary | ICD-10-CM

## 2023-11-10 DIAGNOSIS — J90 Pleural effusion, not elsewhere classified: Secondary | ICD-10-CM | POA: Diagnosis not present

## 2023-11-13 ENCOUNTER — Telehealth (HOSPITAL_COMMUNITY): Payer: Self-pay

## 2023-11-13 NOTE — Telephone Encounter (Signed)
 Called and spoke with pt in regards to CR, pt stated he was in a meeting and will give [place] a call back later.   Placed pt ppw back in ready to schedule bin.

## 2023-11-13 NOTE — Telephone Encounter (Signed)
 Pt insurance is active and benefits verified through [facility]. Co-pay $40.00, DED $0.00/$0.00 met, out of pocket $4,150.00/$130.00 met, co-insurance 0%. No pre-authorization required. Passport, 11/13/23 @ 1:29PM, REF#[id number]   How many CR sessions are covered? (36 visits for TCR, 72 visits for ICR)72 Is this a lifetime maximum or an annual maximum? Annual Has the member used any of these services to date? No Is there a time limit (weeks/months) on start of program and/or program completion? No

## 2023-11-14 ENCOUNTER — Telehealth (HOSPITAL_COMMUNITY): Payer: Self-pay

## 2023-11-14 NOTE — Telephone Encounter (Signed)
 Pt insurance is active and benefits verified through [facility]. Co-pay $40.00, DED $0.00/$0.00 met, out of pocket $4,150.00/$130.00 met, co-insurance 0%. No pre-authorization required. Passport, 11/14/23 @ 10:06AM, REF#[id number]   How many CR sessions are covered? (36 visits for TCR, 72 visits for ICR)72 Is this a lifetime maximum or an annual maximum? Annual Has the member used any of these services to date? No Is there a time limit (weeks/months) on start of program and/or program completion? No

## 2023-11-14 NOTE — Telephone Encounter (Signed)
 Called patient to see if he was interested in participating in the Cardiac Rehab Program. Patient stated yes. Patient will come in for orientation on 11/22/23 @ 8AM and will attend the 10:15AM exercise class. Went over insurance, patient verbalized understanding.   Pensions consultant.

## 2023-11-15 DIAGNOSIS — H401131 Primary open-angle glaucoma, bilateral, mild stage: Secondary | ICD-10-CM | POA: Diagnosis not present

## 2023-11-16 ENCOUNTER — Telehealth (HOSPITAL_COMMUNITY): Payer: Self-pay

## 2023-11-16 NOTE — Telephone Encounter (Signed)
  Called pt to confirm appt for 11/22/23 at 0800. Gave pt instructions for appt, what to wear, office address, eating/taking meds before, and if sick to call and reschedule. Pt voiced understanding, all questions answered.   Health history completed? Yes   Jonna Coup, MS, ACSM-CEP 11/16/2023 1:57 PM

## 2023-11-22 ENCOUNTER — Encounter (HOSPITAL_COMMUNITY)
Admission: RE | Admit: 2023-11-22 | Discharge: 2023-11-22 | Disposition: A | Discharge status: Home / Self Care | Payer: Medicare HMO | Source: Ambulatory Visit | Attending: Cardiology | Admitting: Cardiology

## 2023-11-22 VITALS — BP 112/58 | HR 64 | Ht 76.0 in | Wt 191.8 lb

## 2023-11-22 DIAGNOSIS — Z9889 Other specified postprocedural states: Secondary | ICD-10-CM | POA: Diagnosis present

## 2023-11-22 NOTE — Progress Notes (Signed)
Cardiac Individual Treatment Plan  Patient Details  Name: Jeffrey Delao, MD MRN: [id number] Date of Birth: [birth year] Referring Provider:   Flowsheet Row INTENSIVE CARDIAC REHAB ORIENT from 11/22/2023 in [facility]  Referring Provider Donato Schultz, MD       Initial Encounter Date:  Flowsheet Row INTENSIVE CARDIAC REHAB ORIENT from 11/22/2023 in [facility]  Date 11/22/23       Visit Diagnosis: 10/08/24 S/P aortic valve repair  Patient's Home Medications on Admission:  Current Outpatient Medications:    apixaban (ELIQUIS) 5 MG TABS tablet, Take 1 tablet (5 mg total) by mouth 2 (two) times daily., Disp: 60 tablet, Rfl: 4   aspirin 81 MG chewable tablet, Chew 81 mg by mouth daily., Disp: , Rfl:    FLUoxetine (PROZAC) 20 MG tablet, Take 20 mg by mouth daily., Disp: , Rfl:    metoprolol tartrate (LOPRESSOR) 25 MG tablet, Take 12.5 mg by mouth daily., Disp: , Rfl:    Multiple Vitamins-Minerals (MULTIVITAMIN ADULTS PO), Take by mouth., Disp: , Rfl:    omeprazole 20 MG TBDD disintegrating tablet, Take 20 mg by mouth daily., Disp: , Rfl:    rosuvastatin (CRESTOR) 10 MG tablet, Take by mouth 2 (two) times a week., Disp: , Rfl:    acetaminophen (TYLENOL) 500 MG tablet, Take 2 tablets by mouth every 6 (six) hours as needed. (Patient not taking: Reported on 11/22/2023), Disp: , Rfl:    LORazepam (ATIVAN) 1 MG tablet, Take 0.5 mg by mouth daily as needed. (Patient not taking: Reported on 11/22/2023), Disp: , Rfl:    mometasone (ELOCON) 0.1 % cream, Apply topically as needed. (Patient not taking: Reported on 11/22/2023), Disp: , Rfl:   Past Medical History: Past Medical History:  Diagnosis Date   Anxiety    Cancer (HCC)    skin cancers/melanoma and basal cell.   Depression    Dyspepsia    Dysthymia    Family history of prostate cancer    Gilbert's syndrome    Glaucoma     Heartburn    History of adenomatous polyp of colon    History of melanoma    Hyperlipidemia    Liver hemangioma    Low HDL (under 40)    Microcytic anemia    Migraine headache    Nephrolithiasis    Palpitations    Primary open angle glaucoma of both eyes, unspecified glaucoma stage    Pure hypercholesterolemia    Tinnitus of both ears     Tobacco Use: Social History   Tobacco Use  Smoking Status Never  Smokeless Tobacco Never    Labs: Review Flowsheet        No data to display          Capillary Blood Glucose: No results found for: "GLUCAP"   Exercise Target Goals: Exercise Program Goal: Individual exercise prescription set using results from initial 6 min walk test and THRR while considering  patient's activity barriers and safety.   Exercise Prescription Goal: Initial exercise prescription builds to 30-45 minutes a day of aerobic activity, 2-3 days per week.  Home exercise guidelines will be given to patient during program as part of exercise prescription that the participant will acknowledge.  Activity Barriers & Risk Stratification:  Activity Barriers & Cardiac Risk Stratification - 11/22/23 0823       Activity Barriers & Cardiac Risk Stratification   Activity Barriers Other (comment)  Comments sternal precautions    Cardiac Risk Stratification High   <5 METs on            6 Minute Walk:  6 Minute Walk     Row Name 11/22/23 1003         6 Minute Walk   Phase Initial     Distance 1820 feet     Walk Time 6 minutes     # of Rest Breaks 0     MPH 3.45     METS 4.36     RPE 10     Perceived Dyspnea  1.5     VO2 Peak 15.26     Symptoms Yes (comment)     Comments 2/10 incision/sternal pain, SOB. Resolved with rest     Resting HR 60 bpm     Resting BP 112/58     Resting Oxygen Saturation  98 %     Exercise Oxygen Saturation  during 6 min walk 96 %     Max Ex. HR 95 bpm     Max Ex. BP 146/60     2 Minute Post BP 128/62               Oxygen Initial Assessment:   Oxygen Re-Evaluation:   Oxygen Discharge (Final Oxygen Re-Evaluation):   Initial Exercise Prescription:  Initial Exercise Prescription - 11/22/23 1000       Date of Initial Exercise RX and Referring Provider   Date 11/22/23    Referring Provider Donato Schultz, MD    Expected Discharge Date 02/14/24      Treadmill   MPH 3.2    Grade 0    Minutes 15    METs 3.5      Recumbant Elliptical   Level 2    RPM 60    Watts 90    Minutes 15    METs 3.5      Prescription Details   Frequency (times per week) 3    Duration Progress to 30 minutes of continuous aerobic without signs/symptoms of physical distress      Intensity   THRR 40-80% of Max Heartrate 60-120    Ratings of Perceived Exertion 11-13    Perceived Dyspnea 0-4      Progression   Progression Continue progressive overload as per policy without signs/symptoms or physical distress.      Resistance Training   Training Prescription Yes    Weight 4    Reps 10-15             Perform Capillary Blood Glucose checks as needed.  Exercise Prescription Changes:   Exercise Comments:   Exercise Goals and Review:   Exercise Goals     Row Name 11/22/23 0745             Exercise Goals   Increase Physical Activity Yes       Intervention Provide advice, education, support and counseling about physical activity/exercise needs.;Develop an individualized exercise prescription for aerobic and resistive training based on initial evaluation findings, risk stratification, comorbidities and participant's personal goals.       Expected Outcomes Short Term: Attend rehab on a regular basis to increase amount of physical activity.;Long Term: Exercising regularly at least 3-5 days a week.;Long Term: Add in home exercise to make exercise part of routine and to increase amount of physical activity.       Increase Strength and Stamina Yes       Intervention Provide advice, education,  support and counseling about physical activity/exercise needs.;Develop an individualized exercise prescription for aerobic and resistive training based on initial evaluation findings, risk stratification, comorbidities and participant's personal goals.       Expected Outcomes Short Term: Increase workloads from initial exercise prescription for resistance, speed, and METs.;Short Term: Perform resistance training exercises routinely during rehab and add in resistance training at home;Long Term: Improve cardiorespiratory fitness, muscular endurance and strength as measured by increased METs and functional capacity ( )       Able to understand and use rate of perceived exertion (RPE) scale Yes       Intervention Provide education and explanation on how to use RPE scale       Expected Outcomes Short Term: Able to use RPE daily in rehab to express subjective intensity level;Long Term:  Able to use RPE to guide intensity level when exercising independently       Knowledge and understanding of Target Heart Rate Range (THRR) Yes       Intervention Provide education and explanation of THRR including how the numbers were predicted and where they are located for reference       Expected Outcomes Short Term: Able to state/look up THRR;Short Term: Able to use daily as guideline for intensity in rehab;Long Term: Able to use THRR to govern intensity when exercising independently       Understanding of Exercise Prescription Yes       Intervention Provide education, explanation, and written materials on patient's individual exercise prescription       Expected Outcomes Short Term: Able to explain program exercise prescription;Long Term: Able to explain home exercise prescription to exercise independently                Exercise Goals Re-Evaluation :   Discharge Exercise Prescription (Final Exercise Prescription Changes):   Nutrition:  Target Goals: Understanding of nutrition guidelines, daily intake of  sodium 1500mg , cholesterol 200mg , calories 30% from fat and 7% or less from saturated fats, daily to have 5 or more servings of fruits and vegetables.  Biometrics:  Pre Biometrics - 11/22/23 0820       Pre Biometrics   Waist Circumference 37 inches    Hip Circumference 38 inches    Waist to Hip Ratio 0.97 %    Triceps Skinfold 11 mm    % Body Fat 22.7 %    Grip Strength 54 kg    Flexibility 11.25 in   knees bent. with knees straight/flat could not reach   Single Leg Stand 30 seconds              Nutrition Therapy Plan and Nutrition Goals:   Nutrition Assessments:  MEDIFICTS Score Key: >=70 Need to make dietary changes  40-70 Heart Healthy Diet <= 40 Therapeutic Level Cholesterol Diet   Flowsheet Row INTENSIVE CARDIAC REHAB ORIENT from 11/22/2023 in [facility]  Picture Your Plate Total Score on Admission 82      Picture Your Plate Scores: <78 Unhealthy dietary pattern with much room for improvement. 41-50 Dietary pattern unlikely to meet recommendations for good health and room for improvement. 51-60 More healthful dietary pattern, with some room for improvement.  >60 Healthy dietary pattern, although there may be some specific behaviors that could be improved.    Nutrition Goals Re-Evaluation:   Nutrition Goals Re-Evaluation:   Nutrition Goals Discharge (Final Nutrition Goals Re-Evaluation):   Psychosocial: Target Goals: Acknowledge presence or absence of significant  depression and/or stress, maximize coping skills, provide positive support system. Participant is able to verbalize types and ability to use techniques and skills needed for reducing stress and depression.  Initial Review & Psychosocial Screening:  Initial Psych Review & Screening - 11/22/23 0832       Initial Review   Current issues with None Identified      Family Dynamics   Good Support System? Yes   Titus has his wife for  support     Barriers   Psychosocial barriers to participate in program There are no identifiable barriers or psychosocial needs.      Screening Interventions   Interventions Encouraged to exercise;Provide feedback about the scores to participant    Expected Outcomes Short Term goal: Identification and review with participant of any Quality of Life or Depression concerns found by scoring the questionnaire.;Long Term goal: The participant improves quality of Life and PHQ9 Scores as seen by post scores and/or verbalization of changes             Quality of Life Scores:  Quality of Life - 11/22/23 0838       Quality of Life   Select Quality of Life      Quality of Life Scores   Health/Function Pre 28.7 %    Socioeconomic Pre 30 %    Psych/Spiritual Pre 27.14 %    Family Pre 30 %    GLOBAL Pre 28.84 %            Scores of 19 and below usually indicate a poorer quality of life in these areas.  A difference of  2-3 points is a clinically meaningful difference.  A difference of 2-3 points in the total score of the Quality of Life Index has been associated with significant improvement in overall quality of life, self-image, physical symptoms, and general health in studies assessing change in quality of life.  PHQ-9: Review Flowsheet       11/22/2023  Depression screen PHQ 2/9  Decreased Interest 0  Down, Depressed, Hopeless 0  PHQ - 2 Score 0  Altered sleeping 2  Tired, decreased energy 0  Change in appetite 0  Feeling bad or failure about yourself  0  Trouble concentrating 0  Moving slowly or fidgety/restless 0  Suicidal thoughts 0  PHQ-9 Score 2  Difficult doing work/chores Not difficult at all   Interpretation of Total Score  Total Score Depression Severity:  1-4 = Minimal depression, 5-9 = Mild depression, 10-14 = Moderate depression, 15-19 = Moderately severe depression, 20-27 = Severe depression   Psychosocial Evaluation and Intervention:   Psychosocial  Re-Evaluation:   Psychosocial Discharge (Final Psychosocial Re-Evaluation):   Vocational Rehabilitation: Provide vocational rehab assistance to qualifying candidates.   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 Participant will state understanding/return demonstration of topics presented.     Core Videos: Exercise    Move It!  Clinical staff conducted group or individual video education with verbal and written material and guidebook.  Patient learns the recommended Pritikin exercise program. Exercise with the goal of living a long, healthy life. Some of the health benefits  of exercise include controlled diabetes, healthier blood pressure levels, improved cholesterol levels, improved heart and lung capacity, improved sleep, and better body composition. Everyone should speak with their doctor before starting or changing an exercise routine.  Biomechanical Limitations Clinical staff conducted group or individual video education with verbal and written material and guidebook.  Patient learns how biomechanical limitations can impact exercise and how we can mitigate and possibly overcome limitations to have an impactful and balanced exercise routine.  Body Composition Clinical staff conducted group or individual video education with verbal and written material and guidebook.  Patient learns that body composition (ratio of muscle mass to fat mass) is a key component to assessing overall fitness, rather than body weight alone. Increased fat mass, especially visceral belly fat, can put [place] at increased risk for metabolic syndrome, type 2 diabetes, heart  disease, and even death. It is recommended to combine diet and exercise (cardiovascular and resistance training) to improve your body composition. Seek guidance from your physician and exercise physiologist before implementing an exercise routine.  Exercise Action Plan Clinical staff conducted group or individual video education with verbal and written material and guidebook.  Patient learns the recommended strategies to achieve and enjoy long-term exercise adherence, including variety, self-motivation, self-efficacy, and positive decision making. Benefits of exercise include fitness, good health, weight management, more energy, better sleep, less stress, and overall well-being.  Medical   Heart Disease Risk Reduction Clinical staff conducted group or individual video education with verbal and written material and guidebook.  Patient learns our heart is our most vital organ as it circulates oxygen, nutrients, white blood cells, and hormones throughout the entire body, and carries waste away. Data supports a plant-based eating plan like the Pritikin Program for its effectiveness in slowing progression of and reversing heart disease. The video provides a number of recommendations to address heart disease.   Metabolic Syndrome and Belly Fat  Clinical staff conducted group or individual video education with verbal and written material and guidebook.  Patient learns what metabolic syndrome is, how it leads to heart disease, and how one can reverse it and keep it from coming back. You have metabolic syndrome if you have 3 of the following 5 criteria: abdominal obesity, high blood pressure, high triglycerides, low HDL cholesterol, and high blood sugar.  Hypertension and Heart Disease Clinical staff conducted group or individual video education with verbal and written material and guidebook.  Patient learns that high blood pressure, or hypertension, is very common in the [place]. Hypertension is  largely due to excessive salt intake, but other important risk factors include being overweight, physical inactivity, drinking too much alcohol, smoking, and not eating enough potassium from fruits and vegetables. High blood pressure is a leading risk factor for heart attack, stroke, congestive heart failure, dementia, kidney failure, and premature death. Long-term effects of excessive salt intake include stiffening of the arteries and thickening of heart muscle and organ damage. Recommendations include ways to reduce hypertension and the risk of heart disease.  Diseases of Our Time - Focusing on Diabetes Clinical staff conducted group or individual video education with verbal and written material and guidebook.  Patient learns why the best way to stop diseases of our time is prevention, through food and other lifestyle changes. Medicine (such as prescription pills and surgeries) is often only a Band-Aid on the problem, not a long-term solution. Most common diseases of our time include obesity, type 2 diabetes, hypertension, heart disease, and cancer. The Pritikin Program is  recommended and has been proven to help reduce, reverse, and/or prevent the damaging effects of metabolic syndrome.  Nutrition   Overview of the Pritikin Eating Plan  Clinical staff conducted group or individual video education with verbal and written material and guidebook.  Patient learns about the Pritikin Eating Plan for disease risk reduction. The Pritikin Eating Plan emphasizes a wide variety of unrefined, minimally-processed carbohydrates, like fruits, vegetables, whole grains, and legumes. Go, Caution, and Stop food choices are explained. Plant-based and lean animal proteins are emphasized. Rationale provided for low sodium intake for blood pressure control, low added sugars for blood sugar stabilization, and low added fats and oils for coronary artery disease risk reduction and weight management.  Calorie Density  Clinical  staff conducted group or individual video education with verbal and written material and guidebook.  Patient learns about calorie density and how it impacts the Pritikin Eating Plan. Knowing the characteristics of the food you choose will help you decide whether those foods will lead to weight gain or weight loss, and whether you want to consume more or less of them. Weight loss is usually a side effect of the Pritikin Eating Plan because of its focus on low calorie-dense foods.  Label Reading  Clinical staff conducted group or individual video education with verbal and written material and guidebook.  Patient learns about the Pritikin recommended label reading guidelines and corresponding recommendations regarding calorie density, added sugars, sodium content, and whole grains.  Dining Out - Part 1  Clinical staff conducted group or individual video education with verbal and written material and guidebook.  Patient learns that restaurant meals can be sabotaging because they can be so high in calories, fat, sodium, and/or sugar. Patient learns recommended strategies on how to positively address this and avoid unhealthy pitfalls.  Facts on Fats  Clinical staff conducted group or individual video education with verbal and written material and guidebook.  Patient learns that lifestyle modifications can be just as effective, if not more so, as many medications for lowering your risk of heart disease. A Pritikin lifestyle can help to reduce your risk of inflammation and atherosclerosis (cholesterol build-up, or plaque, in the artery walls). Lifestyle interventions such as dietary choices and physical activity address the cause of atherosclerosis. A review of the types of fats and their impact on blood cholesterol levels, along with dietary recommendations to reduce fat intake is also included.  Nutrition Action Plan  Clinical staff conducted group or individual video education with verbal and written  material and guidebook.  Patient learns how to incorporate Pritikin recommendations into their lifestyle. Recommendations include planning and keeping personal health goals in mind as an important part of their success.  Healthy Mind-Set    Healthy Minds, Bodies, Hearts  Clinical staff conducted group or individual video education with verbal and written material and guidebook.  Patient learns how to identify when they are stressed. Video will discuss the impact of that stress, as well as the many benefits of stress management. Patient will also be introduced to stress management techniques. The way we think, act, and feel has an impact on our hearts.  How Our Thoughts Can Heal Our Hearts  Clinical staff conducted group or individual video education with verbal and written material and guidebook.  Patient learns that negative thoughts can cause depression and anxiety. This can result in negative lifestyle behavior and serious health problems. Cognitive behavioral therapy is an effective method to help control our thoughts in order to change and  improve our emotional outlook.  Additional Videos:  Exercise    Improving Performance  Clinical staff conducted group or individual video education with verbal and written material and guidebook.  Patient learns to use a non-linear approach by alternating intensity levels and lengths of time spent exercising to help burn more calories and lose more body fat. Cardiovascular exercise helps improve heart health, metabolism, hormonal balance, blood sugar control, and recovery from fatigue. Resistance training improves strength, endurance, balance, coordination, reaction time, metabolism, and muscle mass. Flexibility exercise improves circulation, posture, and balance. Seek guidance from your physician and exercise physiologist before implementing an exercise routine and learn your capabilities and proper form for all exercise.  Introduction to Yoga  Clinical  staff conducted group or individual video education with verbal and written material and guidebook.  Patient learns about yoga, a discipline of the coming together of mind, breath, and body. The benefits of yoga include improved flexibility, improved range of motion, better posture and core strength, increased lung function, weight loss, and positive self-image. Yoga's heart health benefits include lowered blood pressure, healthier heart rate, decreased cholesterol and triglyceride levels, improved immune function, and reduced stress. Seek guidance from your physician and exercise physiologist before implementing an exercise routine and learn your capabilities and proper form for all exercise.  Medical   Aging: Enhancing Your Quality of Life  Clinical staff conducted group or individual video education with verbal and written material and guidebook.  Patient learns key strategies and recommendations to stay in good physical health and enhance quality of life, such as prevention strategies, having an advocate, securing a Health Care Proxy and Power of Attorney, and keeping a list of medications and system for tracking them. It also discusses how to avoid risk for bone loss.  Biology of Weight Control  Clinical staff conducted group or individual video education with verbal and written material and guidebook.  Patient learns that weight gain occurs because we consume more calories than we burn (eating more, moving less). Even if your body weight is normal, you may have higher ratios of fat compared to muscle mass. Too much body fat puts you at increased risk for cardiovascular disease, heart attack, stroke, type 2 diabetes, and obesity-related cancers. In addition to exercise, following the Pritikin Eating Plan can help reduce your risk.  Decoding Lab Results  Clinical staff conducted group or individual video education with verbal and written material and guidebook.  Patient learns that lab test  reflects one measurement whose values change over time and are influenced by many factors, including medication, stress, sleep, exercise, food, hydration, pre-existing medical conditions, and more. It is recommended to use the knowledge from this video to become more involved with your lab results and evaluate your numbers to speak with your doctor.   Diseases of Our Time - Overview  Clinical staff conducted group or individual video education with verbal and written material and guidebook.  Patient learns that according to the CDC, 50% to 70% of chronic diseases (such as obesity, type 2 diabetes, elevated lipids, hypertension, and heart disease) are avoidable through lifestyle improvements including healthier food choices, listening to satiety cues, and increased physical activity.  Sleep Disorders Clinical staff conducted group or individual video education with verbal and written material and guidebook.  Patient learns how good quality and duration of sleep are important to overall health and well-being. Patient also learns about sleep disorders and how they impact health along with recommendations to address them, including discussing with a  physician.  Nutrition  Dining Out - Part 2 Clinical staff conducted group or individual video education with verbal and written material and guidebook.  Patient learns how to plan ahead and communicate in order to maximize their dining experience in a healthy and nutritious manner. Included are recommended food choices based on the type of restaurant the patient is visiting.   Fueling a Banker conducted group or individual video education with verbal and written material and guidebook.  There is a strong connection between our food choices and our health. Diseases like obesity and type 2 diabetes are very prevalent and are in large-part due to lifestyle choices. The Pritikin Eating Plan provides plenty of food and hunger-curbing  satisfaction. It is easy to follow, affordable, and helps reduce health risks.  Menu Workshop  Clinical staff conducted group or individual video education with verbal and written material and guidebook.  Patient learns that restaurant meals can sabotage health goals because they are often packed with calories, fat, sodium, and sugar. Recommendations include strategies to plan ahead and to communicate with the manager, chef, or server to help order a healthier meal.  Planning Your Eating Strategy  Clinical staff conducted group or individual video education with verbal and written material and guidebook.  Patient learns about the Pritikin Eating Plan and its benefit of reducing the risk of disease. The Pritikin Eating Plan does not focus on calories. Instead, it emphasizes high-quality, nutrient-rich foods. By knowing the characteristics of the foods, we choose, we can determine their calorie density and make informed decisions.  Targeting Your Nutrition Priorities  Clinical staff conducted group or individual video education with verbal and written material and guidebook.  Patient learns that lifestyle habits have a tremendous impact on disease risk and progression. This video provides eating and physical activity recommendations based on your personal health goals, such as reducing LDL cholesterol, losing weight, preventing or controlling type 2 diabetes, and reducing high blood pressure.  Vitamins and Minerals  Clinical staff conducted group or individual video education with verbal and written material and guidebook.  Patient learns different ways to obtain key vitamins and minerals, including through a recommended healthy diet. It is important to discuss all supplements you take with your doctor.   Healthy Mind-Set    Smoking Cessation  Clinical staff conducted group or individual video education with verbal and written material and guidebook.  Patient learns that cigarette smoking and  tobacco addiction pose a serious health risk which affects millions of people. Stopping smoking will significantly reduce the risk of heart disease, lung disease, and many forms of cancer. Recommended strategies for quitting are covered, including working with your doctor to develop a successful plan.  Culinary   Becoming a Set designer conducted group or individual video education with verbal and written material and guidebook.  Patient learns that cooking at home can be healthy, cost-effective, quick, and puts them in control. Keys to cooking healthy recipes will include looking at your recipe, assessing your equipment needs, planning ahead, making it simple, choosing cost-effective seasonal ingredients, and limiting the use of added fats, salts, and sugars.  Cooking - Breakfast and Snacks  Clinical staff conducted group or individual video education with verbal and written material and guidebook.  Patient learns how important breakfast is to satiety and nutrition through the entire day. Recommendations include key foods to eat during breakfast to help stabilize blood sugar levels and to prevent overeating at meals later in the  day. Planning ahead is also a key component.  Cooking - Educational psychologist conducted group or individual video education with verbal and written material and guidebook.  Patient learns eating strategies to improve overall health, including an approach to cook more at home. Recommendations include thinking of animal protein as a side on your plate rather than center stage and focusing instead on lower calorie dense options like vegetables, fruits, whole grains, and plant-based proteins, such as beans. Making sauces in large quantities to freeze for later and leaving the skin on your vegetables are also recommended to maximize your experience.  Cooking - Healthy Salads and Dressing Clinical staff conducted group or individual video education with  verbal and written material and guidebook.  Patient learns that vegetables, fruits, whole grains, and legumes are the foundations of the Pritikin Eating Plan. Recommendations include how to incorporate each of these in flavorful and healthy salads, and how to create homemade salad dressings. Proper handling of ingredients is also covered. Cooking - Soups and [employer] - Soups and Desserts Clinical staff conducted group or individual video education with verbal and written material and guidebook.  Patient learns that Pritikin soups and desserts make for easy, nutritious, and delicious snacks and meal components that are low in sodium, fat, sugar, and calorie density, while high in vitamins, minerals, and filling fiber. Recommendations include simple and healthy ideas for soups and desserts.   Overview     The Pritikin Solution Program Overview Clinical staff conducted group or individual video education with verbal and written material and guidebook.  Patient learns that the results of the Pritikin Program have been documented in more than 100 articles published in peer-reviewed journals, and the benefits include reducing risk factors for (and, in some cases, even reversing) high cholesterol, high blood pressure, type 2 diabetes, obesity, and more! An overview of the three key pillars of the Pritikin Program will be covered: eating well, doing regular exercise, and having a healthy mind-set.  WORKSHOPS  Exercise: Exercise Basics: Building Your Action Plan Clinical staff led group instruction and group discussion with PowerPoint presentation and patient guidebook. To enhance the learning environment the use of posters, models and videos may be added. At the conclusion of this workshop, patients will comprehend the difference between physical activity and exercise, as well as the benefits of incorporating both, into their routine. Patients will understand the FITT (Frequency, Intensity, Time,  and Type) principle and how to use it to build an exercise action plan. In addition, safety concerns and other considerations for exercise and cardiac rehab will be addressed by the presenter. The purpose of this lesson is to promote a comprehensive and effective weekly exercise routine in order to improve patients' overall level of fitness.   Managing Heart Disease: Your Path to a Healthier Heart Clinical staff led group instruction and group discussion with PowerPoint presentation and patient guidebook. To enhance the learning environment the use of posters, models and videos may be added.At the conclusion of this workshop, patients will understand the anatomy and physiology of the heart. Additionally, they will understand how Pritikin's three pillars impact the risk factors, the progression, and the management of heart disease.  The purpose of this lesson is to provide a high-level overview of the heart, heart disease, and how the Pritikin lifestyle positively impacts risk factors.  Exercise Biomechanics Clinical staff led group instruction and group discussion with PowerPoint presentation and patient guidebook. To enhance the learning environment the use of  posters, models and videos may be added. Patients will learn how the structural parts of their bodies function and how these functions impact their daily activities, movement, and exercise. Patients will learn how to promote a neutral spine, learn how to manage pain, and identify ways to improve their physical movement in order to promote healthy living. The purpose of this lesson is to expose patients to common physical limitations that impact physical activity. Participants will learn practical ways to adapt and manage aches and pains, and to minimize their effect on regular exercise. Patients will learn how to maintain good posture while sitting, walking, and lifting.  Balance Training and Fall Prevention  Clinical staff led group  instruction and group discussion with PowerPoint presentation and patient guidebook. To enhance the learning environment the use of posters, models and videos may be added. At the conclusion of this workshop, patients will understand the importance of their sensorimotor skills (vision, proprioception, and the vestibular system) in maintaining their ability to balance as they age. Patients will apply a variety of balancing exercises that are appropriate for their current level of function. Patients will understand the common causes for poor balance, possible solutions to these problems, and ways to modify their physical environment in order to minimize their fall risk. The purpose of this lesson is to teach patients about the importance of maintaining balance as they age and ways to minimize their risk of falling.  WORKSHOPS   Nutrition:  Fueling a Ship broker led group instruction and group discussion with PowerPoint presentation and patient guidebook. To enhance the learning environment the use of posters, models and videos may be added. Patients will review the foundational principles of the Pritikin Eating Plan and understand what constitutes a serving size in each of the food groups. Patients will also learn Pritikin-friendly foods that are better choices when away from home and review make-ahead meal and snack options. Calorie density will be reviewed and applied to three nutrition priorities: weight maintenance, weight loss, and weight gain. The purpose of this lesson is to reinforce (in a group setting) the key concepts around what patients are recommended to eat and how to apply these guidelines when away from home by planning and selecting Pritikin-friendly options. Patients will understand how calorie density may be adjusted for different weight management goals.  Mindful Eating  Clinical staff led group instruction and group discussion with PowerPoint presentation and patient  guidebook. To enhance the learning environment the use of posters, models and videos may be added. Patients will briefly review the concepts of the Pritikin Eating Plan and the importance of low-calorie dense foods. The concept of mindful eating will be introduced as well as the importance of paying attention to internal hunger signals. Triggers for non-hunger eating and techniques for dealing with triggers will be explored. The purpose of this lesson is to provide patients with the opportunity to review the basic principles of the Pritikin Eating Plan, discuss the value of eating mindfully and how to measure internal cues of hunger and fullness using the Hunger Scale. Patients will also discuss reasons for non-hunger eating and learn strategies to use for controlling emotional eating.  Targeting Your Nutrition Priorities Clinical staff led group instruction and group discussion with PowerPoint presentation and patient guidebook. To enhance the learning environment the use of posters, models and videos may be added. Patients will learn how to determine their genetic susceptibility to disease by reviewing their family history. Patients will gain insight into the importance  of diet as part of an overall healthy lifestyle in mitigating the impact of genetics and other environmental insults. The purpose of this lesson is to provide patients with the opportunity to assess their personal nutrition priorities by looking at their family history, their own health history and current risk factors. Patients will also be able to discuss ways of prioritizing and modifying the Pritikin Eating Plan for their highest risk areas  Menu  Clinical staff led group instruction and group discussion with PowerPoint presentation and patient guidebook. To enhance the learning environment the use of posters, models and videos may be added. Using menus brought in from [employer], or printed from [employer], patients will apply  the Pritikin dining out guidelines that were presented in the [employer] video. Patients will also be able to practice these guidelines in a variety of provided scenarios. The purpose of this lesson is to provide patients with the opportunity to practice hands-on learning of the Pritikin Dining Out guidelines with actual menus and practice scenarios.  Label Reading Clinical staff led group instruction and group discussion with PowerPoint presentation and patient guidebook. To enhance the learning environment the use of posters, models and videos may be added. Patients will review and discuss the Pritikin label reading guidelines presented in Pritikin's Label Reading Educational series video. Using fool labels brought in from local grocery stores and markets, patients will apply the label reading guidelines and determine if the packaged food meet the Pritikin guidelines. The purpose of this lesson is to provide patients with the opportunity to review, discuss, and practice hands-on learning of the Pritikin Label Reading guidelines with actual packaged food labels. Cooking School  Pritikin's [employer] are designed to teach patients ways to prepare quick, simple, and affordable recipes at home. The importance of nutrition's role in chronic disease risk reduction is reflected in its emphasis in the overall Pritikin program. By learning how to prepare essential core Pritikin Eating Plan recipes, patients will increase control over what they eat; be able to customize the flavor of foods without the use of added salt, sugar, or fat; and improve the quality of the food they consume. By learning a set of core recipes which are easily assembled, quickly prepared, and affordable, patients are more likely to prepare more healthy foods at home. These workshops focus on convenient breakfasts, simple entres, side dishes, and desserts which can be prepared with minimal effort and are  consistent with nutrition recommendations for cardiovascular risk reduction. Cooking [employer] are taught by a Armed forces logistics/support/administrative officer (RD) who has been trained by the [employer]. The chef or RD has a clear understanding of the importance of minimizing - if not completely eliminating - added fat, sugar, and sodium in recipes. Throughout the series of Cooking School Workshop sessions, patients will learn about healthy ingredients and efficient methods of cooking to build confidence in their capability to prepare    Cooking School weekly topics:  Adding Flavor- Sodium-Free  Fast and Healthy Breakfasts  Powerhouse Plant-Based Proteins  Satisfying Salads and Dressings  Simple Sides and Sauces  International Cuisine-Spotlight on the [employer] Zones  Delicious Desserts  Savory Soups  [employer] - Meals in a Astronomer Appetizers and Snacks  Comforting Weekend Breakfasts  One-Pot Wonders   Fast Evening Meals  Landscape architect Your Pritikin Plate  WORKSHOPS   Healthy Mindset (Psychosocial):  Focused Goals, Sustainable Changes Clinical staff led group instruction and group discussion  with PowerPoint presentation and patient guidebook. To enhance the learning environment the use of posters, models and videos may be added. Patients will be able to apply effective goal setting strategies to establish at least one personal goal, and then take consistent, meaningful action toward that goal. They will learn to identify common barriers to achieving personal goals and develop strategies to overcome them. Patients will also gain an understanding of how our mind-set can impact our ability to achieve goals and the importance of cultivating a positive and growth-oriented mind-set. The purpose of this lesson is to provide patients with a deeper understanding of how to set and achieve personal goals, as well as the tools and strategies needed to overcome common  obstacles which may arise along the way.  From Head to Heart: The Power of a Healthy Outlook  Clinical staff led group instruction and group discussion with PowerPoint presentation and patient guidebook. To enhance the learning environment the use of posters, models and videos may be added. Patients will be able to recognize and describe the impact of emotions and mood on physical health. They will discover the importance of self-care and explore self-care practices which may work for them. Patients will also learn how to utilize the 4 C's to cultivate a healthier outlook and better manage stress and challenges. The purpose of this lesson is to demonstrate to patients how a healthy outlook is an essential part of maintaining good health, especially as they continue their cardiac rehab journey.  Healthy Sleep for a Healthy Heart Clinical staff led group instruction and group discussion with PowerPoint presentation and patient guidebook. To enhance the learning environment the use of posters, models and videos may be added. At the conclusion of this workshop, patients will be able to demonstrate knowledge of the importance of sleep to overall health, well-being, and quality of life. They will understand the symptoms of, and treatments for, common sleep disorders. Patients will also be able to identify daytime and nighttime behaviors which impact sleep, and they will be able to apply these tools to help manage sleep-related challenges. The purpose of this lesson is to provide patients with a general overview of sleep and outline the importance of quality sleep. Patients will learn about a few of the most common sleep disorders. Patients will also be introduced to the concept of "sleep hygiene," and discover ways to self-manage certain sleeping problems through simple daily behavior changes. Finally, the workshop will motivate patients by clarifying the links between quality sleep and their goals of heart-healthy  living.   Recognizing and Reducing Stress Clinical staff led group instruction and group discussion with PowerPoint presentation and patient guidebook. To enhance the learning environment the use of posters, models and videos may be added. At the conclusion of this workshop, patients will be able to understand the types of stress reactions, differentiate between acute and chronic stress, and recognize the impact that chronic stress has on their health. They will also be able to apply different coping mechanisms, such as reframing negative self-talk. Patients will have the opportunity to practice a variety of stress management techniques, such as deep abdominal breathing, progressive muscle relaxation, and/or guided imagery.  The purpose of this lesson is to educate patients on the role of stress in their lives and to provide healthy techniques for coping with it.  Learning Barriers/Preferences:  Learning Barriers/Preferences - 11/22/23 1610       Learning Barriers/Preferences   Learning Barriers Sight   wears glasses   Learning  Preferences Audio;Computer/Internet;Group Instruction;Individual Instruction;Skilled Demonstration;Pictoral;Verbal Instruction;Video;Written Material             Education Topics:  Knowledge Questionnaire Score:  Knowledge Questionnaire Score - 11/22/23 0834       Knowledge Questionnaire Score   Pre Score 24/24             Core Components/Risk Factors/Patient Goals at Admission:  Personal Goals and Risk Factors at Admission - 11/22/23 0831       Core Components/Risk Factors/Patient Goals on Admission    Weight Management Yes;Weight Maintenance    Intervention Weight Management: Develop a combined nutrition and exercise program designed to reach desired caloric intake, while maintaining appropriate intake of nutrient and fiber, sodium and fats, and appropriate energy expenditure required for the weight goal.;Weight Management: Provide education and  appropriate resources to help participant work on and attain dietary goals.    Expected Outcomes Short Term: Continue to assess and modify interventions until short term weight is achieved;Long Term: Adherence to nutrition and physical activity/exercise program aimed toward attainment of established weight goal;Weight Maintenance: Understanding of the daily nutrition guidelines, which includes 25-35% calories from fat, 7% or less cal from saturated fats, less than 200mg  cholesterol, less than 1.5gm of sodium, & 5 or more servings of fruits and vegetables daily;Understanding recommendations for meals to include 15-35% energy as protein, 25-35% energy from fat, 35-60% energy from carbohydrates, less than 200mg  of dietary cholesterol, 20-35 gm of total fiber daily;Understanding of distribution of calorie intake throughout the day with the consumption of 4-5 meals/snacks    Hypertension Yes    Intervention Provide education on lifestyle modifcations including regular physical activity/exercise, weight management, moderate sodium restriction and increased consumption of fresh fruit, vegetables, and low fat dairy, alcohol moderation, and smoking cessation.;Monitor prescription use compliance.    Expected Outcomes Short Term: Continued assessment and intervention until BP is < 140/73mm HG in hypertensive participants. < 130/81mm HG in hypertensive participants with diabetes, heart failure or chronic kidney disease.;Long Term: Maintenance of blood pressure at goal levels.             Core Components/Risk Factors/Patient Goals Review:    Core Components/Risk Factors/Patient Goals at Discharge (Final Review):    ITP Comments:  ITP Comments     Row Name 11/22/23 0744           ITP Comments Dr. Armanda Magic medical director. Introduction to pritikin education/intensive cardiac rehab. Initial orientation packet reviewed with patient.                Comments: Participant attended orientation for  the cardiac rehabilitation program on  11/22/2023  to perform initial intake and exercise walk test. Patient introduced to the Pritikin Program education and orientation packet was reviewed. Completed 6-minute walk test, measurements, initial ITP, and exercise prescription. Vital signs stable. Telemetry-normal sinus rhythm, asymptomatic. 2/10 sternal/incision pain and SOB RPD = 1.5 during , resolved with rest.   Service time was from 0747 to 0925.  Jonna Coup, MS, ACSM-CEP 11/22/2023 10:07 AM

## 2023-11-22 NOTE — Progress Notes (Signed)
Cardiac Rehab Medication Review   Does the patient  feel that his/her medications are working for him/her?  YES  Has the patient been experiencing any side effects to the medications prescribed?  NO  Does the patient measure his/her own blood pressure or blood glucose at home?  NO   Does the patient have any problems obtaining medications due to transportation or finances?   NO  Understanding of regimen: excellent Understanding of indications: excellent Potential of compliance: excellent    Comments: Jeffrey Liu has a great understanding of his medications and regime. He does not have a BP cuff at home, encouraged to get one if possible.     Jeffrey Coup, MS, ACSM-CEP 11/22/2023 8:21 AM

## 2023-11-27 ENCOUNTER — Encounter (HOSPITAL_COMMUNITY)
Admission: RE | Admit: 2023-11-27 | Discharge: 2023-11-27 | Disposition: A | Discharge status: Home / Self Care | Payer: Medicare HMO | Source: Ambulatory Visit | Attending: Cardiology

## 2023-11-27 ENCOUNTER — Encounter (HOSPITAL_COMMUNITY): Payer: Medicare HMO

## 2023-11-27 DIAGNOSIS — Z9889 Other specified postprocedural states: Secondary | ICD-10-CM | POA: Diagnosis not present

## 2023-11-27 NOTE — Progress Notes (Signed)
Daily Session Note  Patient Details  Name: Jeffrey Mcbryar, MD MRN: [id number] Date of Birth: 1955/11/21 Referring Provider:   Flowsheet Row INTENSIVE CARDIAC REHAB ORIENT from 11/22/2023 in [facility]  Referring Provider Donato Schultz, MD       Encounter Date: 11/27/2023  Check In:  Session Check In - 11/27/23 8657       Check-In   Supervising physician immediately available to respond to emergencies [facility] MD immediately available    Physician(s) Tereso Newcomer, PA-C    Location [facility]    Staff Present Valinda Party, MS, Exercise Physiologist;Johnny Hale Bogus, MS, Exercise Physiologist;Jetta Dan Humphreys BS, ACSM-CEP, Exercise Physiologist;Olinty Peggye Pitt, MS, ACSM-CEP, Exercise Physiologist;Tahani Potier, RN, Marton Redwood, MS, ACSM-CEP, CCRP, Exercise Physiologist    Virtual Visit No    Medication changes reported     No    Fall or balance concerns reported    No    Tobacco Cessation No Change    Warm-up and Cool-down Performed as group-led instruction    Resistance Training Performed Yes    VAD Patient? No    PAD/SET Patient? No      Pain Assessment   Currently in Pain? No/denies    Pain Score 0-No pain    Multiple Pain Sites No             Capillary Blood Glucose: No results found for this or any previous visit (from the past 24 hours).   Exercise Prescription Changes - 11/27/23 1400       Response to Exercise   Blood Pressure (Admit) 110/58    Blood Pressure (Exercise) 116/64    Blood Pressure (Exit) 104/52    Heart Rate (Admit) 64 bpm    Heart Rate (Exercise) 74 bpm    Heart Rate (Exit) 58 bpm    Rating of Perceived Exertion (Exercise) 10    Symptoms None    Comments Pt's first day in the CRP2 program    Duration Continue with 30 min of aerobic exercise without signs/symptoms of physical distress.    Intensity THRR unchanged      Progression   Progression Continue to progress  workloads to maintain intensity without signs/symptoms of physical distress.    Average METs 4.9      Resistance Training   Training Prescription Yes    Weight 4    Reps 10-15    Time 10 Minutes      Interval Training   Interval Training No      NuStep   Level 2    SPM --   No mets of SPM   Minutes 15      Recumbant Elliptical   Level 2    RPM 71    Watts 105    Minutes 15    METs 4.9             Social History   Tobacco Use  Smoking Status Never  Smokeless Tobacco Never    Goals Met:  Exercise tolerated well No report of concerns or symptoms today Strength training completed today  Goals Unmet:  Not Applicable  Comments: Pt started cardiac rehab today.  Pt tolerated light exercise without difficulty. VSS, telemetry-Sinus Rhythm, asymptomatic.  Medication list reconciled. Pt denies barriers to medicaiton compliance.  PSYCHOSOCIAL ASSESSMENT:  PHQ-2. Brittin says he has difficulty going back to sleep when he wakes up. Eligah works on a crossword puzzle to help with this.  Pt exhibits positive  coping skills, hopeful outlook with supportive family. No psychosocial needs identified at this time, no psychosocial interventions necessary.    Pt enjoys exercise, yard work, gardening,DIY projects .   Pt oriented to exercise equipment and routine.    Understanding verbalized. Thayer Headings RN BSN    Dr. Armanda Magic is Medical Director for Cardiac Rehab at [facility].

## 2023-11-29 ENCOUNTER — Encounter (HOSPITAL_COMMUNITY): Payer: Medicare HMO

## 2023-11-29 ENCOUNTER — Encounter (HOSPITAL_COMMUNITY)
Admission: RE | Admit: 2023-11-29 | Discharge: 2023-11-29 | Disposition: A | Discharge status: Home / Self Care | Payer: Medicare HMO | Source: Ambulatory Visit | Attending: Cardiology | Admitting: Cardiology

## 2023-11-29 ENCOUNTER — Ambulatory Visit: Payer: Medicare HMO | Admitting: Cardiology

## 2023-11-29 DIAGNOSIS — Z9889 Other specified postprocedural states: Secondary | ICD-10-CM | POA: Diagnosis not present

## 2023-11-30 DIAGNOSIS — I9789 Other postprocedural complications and disorders of the circulatory system, not elsewhere classified: Secondary | ICD-10-CM | POA: Diagnosis not present

## 2023-11-30 NOTE — Progress Notes (Signed)
 Cardiac Individual Treatment Plan  Patient Details  Name: Jeffrey Trotta, MD MRN: [id number] Date of Birth: August 13, 1956 Referring Provider:   Flowsheet Row INTENSIVE CARDIAC REHAB ORIENT from 11/22/2023 in [facility]  Referring Provider Donato Schultz, MD       Initial Encounter Date:  Flowsheet Row INTENSIVE CARDIAC REHAB ORIENT from 11/22/2023 in [facility]  Date 11/22/23       Visit Diagnosis: 10/08/24 S/P aortic valve repair  Patient's Home Medications on Admission:  Current Outpatient Medications:    acetaminophen (TYLENOL) 500 MG tablet, Take 2 tablets by mouth every 6 (six) hours as needed. (Patient not taking: Reported on 11/22/2023), Disp: , Rfl:    apixaban (ELIQUIS) 5 MG TABS tablet, Take 1 tablet (5 mg total) by mouth 2 (two) times daily., Disp: 60 tablet, Rfl: 4   aspirin 81 MG chewable tablet, Chew 81 mg by mouth daily., Disp: , Rfl:    FLUoxetine (PROZAC) 20 MG tablet, Take 20 mg by mouth daily., Disp: , Rfl:    LORazepam (ATIVAN) 1 MG tablet, Take 0.5 mg by mouth daily as needed. (Patient not taking: Reported on 11/22/2023), Disp: , Rfl:    metoprolol tartrate (LOPRESSOR) 25 MG tablet, Take 12.5 mg by mouth daily., Disp: , Rfl:    mometasone (ELOCON) 0.1 % cream, Apply topically as needed. (Patient not taking: Reported on 11/22/2023), Disp: , Rfl:    Multiple Vitamins-Minerals (MULTIVITAMIN ADULTS PO), Take by mouth., Disp: , Rfl:    omeprazole 20 MG TBDD disintegrating tablet, Take 20 mg by mouth daily., Disp: , Rfl:    rosuvastatin (CRESTOR) 10 MG tablet, Take by mouth 2 (two) times a week., Disp: , Rfl:   Past Medical History: Past Medical History:  Diagnosis Date   Anxiety    Cancer (HCC)    skin cancers/melanoma and basal cell.   Depression    Dyspepsia    Dysthymia    Family history of prostate cancer    Gilbert's syndrome    Glaucoma     Heartburn    History of adenomatous polyp of colon    History of melanoma    Hyperlipidemia    Liver hemangioma    Low HDL (under 40)    Microcytic anemia    Migraine headache    Nephrolithiasis    Palpitations    Primary open angle glaucoma of both eyes, unspecified glaucoma stage    Pure hypercholesterolemia    Tinnitus of both ears     Tobacco Use: Social History   Tobacco Use  Smoking Status Never  Smokeless Tobacco Never    Labs: Review Flowsheet        No data to display          Capillary Blood Glucose: No results found for: "GLUCAP"   Exercise Target Goals: Exercise Program Goal: Individual exercise prescription set using results from initial 6 min walk test and THRR while considering  patient's activity barriers and safety.   Exercise Prescription Goal: Initial exercise prescription builds to 30-45 minutes a day of aerobic activity, 2-3 days per week.  Home exercise guidelines will be given to patient during program as part of exercise prescription that the participant will acknowledge.  Activity Barriers & Risk Stratification:  Activity Barriers & Cardiac Risk Stratification - 11/22/23 0823       Activity Barriers & Cardiac Risk Stratification   Activity Barriers Other (comment)  Comments sternal precautions    Cardiac Risk Stratification High   <5 METs on            6 Minute Walk:  6 Minute Walk     Row Name 11/22/23 1003         6 Minute Walk   Phase Initial     Distance 1820 feet     Walk Time 6 minutes     # of Rest Breaks 0     MPH 3.45     METS 4.36     RPE 10     Perceived Dyspnea  1.5     VO2 Peak 15.26     Symptoms Yes (comment)     Comments 2/10 incision/sternal pain, SOB. Resolved with rest     Resting HR 60 bpm     Resting BP 112/58     Resting Oxygen Saturation  98 %     Exercise Oxygen Saturation  during 6 min walk 96 %     Max Ex. HR 95 bpm     Max Ex. BP 146/60     2 Minute Post BP 128/62               Oxygen Initial Assessment:   Oxygen Re-Evaluation:   Oxygen Discharge (Final Oxygen Re-Evaluation):   Initial Exercise Prescription:  Initial Exercise Prescription - 11/22/23 1000       Date of Initial Exercise RX and Referring Provider   Date 11/22/23    Referring Provider Donato Schultz, MD    Expected Discharge Date 02/14/24      Treadmill   MPH 3.2    Grade 0    Minutes 15    METs 3.5      Recumbant Elliptical   Level 2    RPM 60    Watts 90    Minutes 15    METs 3.5      Prescription Details   Frequency (times per week) 3    Duration Progress to 30 minutes of continuous aerobic without signs/symptoms of physical distress      Intensity   THRR 40-80% of Max Heartrate 60-120    Ratings of Perceived Exertion 11-13    Perceived Dyspnea 0-4      Progression   Progression Continue progressive overload as per policy without signs/symptoms or physical distress.      Resistance Training   Training Prescription Yes    Weight 4    Reps 10-15             Perform Capillary Blood Glucose checks as needed.  Exercise Prescription Changes:   Exercise Prescription Changes     Row Name 11/27/23 1400             Response to Exercise   Blood Pressure (Admit) 110/58       Blood Pressure (Exercise) 116/64       Blood Pressure (Exit) 104/52       Heart Rate (Admit) 64 bpm       Heart Rate (Exercise) 74 bpm       Heart Rate (Exit) 58 bpm       Rating of Perceived Exertion (Exercise) 10       Symptoms None       Comments Pt's first day in the CRP2 program       Duration Continue with 30 min of aerobic exercise without signs/symptoms of physical distress.       Intensity THRR unchanged  Progression   Progression Continue to progress workloads to maintain intensity without signs/symptoms of physical distress.       Average METs 4.9         Resistance Training   Training Prescription Yes       Weight 4       Reps 10-15       Time 10  Minutes         Interval Training   Interval Training No         NuStep   Level 2       SPM --  No mets of SPM       Minutes 15         Recumbant Elliptical   Level 2       RPM 71       Watts 105       Minutes 15       METs 4.9                Exercise Comments:   Exercise Comments     Row Name 11/27/23 1417           Exercise Comments Pt's first day in the CRP2 program. Pt exercised today with no complaints. Off to a good start.                Exercise Goals and Review:   Exercise Goals     Row Name 11/22/23 0745             Exercise Goals   Increase Physical Activity Yes       Intervention Provide advice, education, support and counseling about physical activity/exercise needs.;Develop an individualized exercise prescription for aerobic and resistive training based on initial evaluation findings, risk stratification, comorbidities and participant's personal goals.       Expected Outcomes Short Term: Attend rehab on a regular basis to increase amount of physical activity.;Long Term: Exercising regularly at least 3-5 days a week.;Long Term: Add in home exercise to make exercise part of routine and to increase amount of physical activity.       Increase Strength and Stamina Yes       Intervention Provide advice, education, support and counseling about physical activity/exercise needs.;Develop an individualized exercise prescription for aerobic and resistive training based on initial evaluation findings, risk stratification, comorbidities and participant's personal goals.       Expected Outcomes Short Term: Increase workloads from initial exercise prescription for resistance, speed, and METs.;Short Term: Perform resistance training exercises routinely during rehab and add in resistance training at home;Long Term: Improve cardiorespiratory fitness, muscular endurance and strength as measured by increased METs and functional capacity ( )       Able to understand  and use rate of perceived exertion (RPE) scale Yes       Intervention Provide education and explanation on how to use RPE scale       Expected Outcomes Short Term: Able to use RPE daily in rehab to express subjective intensity level;Long Term:  Able to use RPE to guide intensity level when exercising independently       Knowledge and understanding of Target Heart Rate Range (THRR) Yes       Intervention Provide education and explanation of THRR including how the numbers were predicted and where they are located for reference       Expected Outcomes Short Term: Able to state/look up THRR;Short Term: Able to use daily as guideline for intensity in rehab;Long Term:  Able to use THRR to govern intensity when exercising independently       Understanding of Exercise Prescription Yes       Intervention Provide education, explanation, and written materials on patient's individual exercise prescription       Expected Outcomes Short Term: Able to explain program exercise prescription;Long Term: Able to explain home exercise prescription to exercise independently                Exercise Goals Re-Evaluation :  Exercise Goals Re-Evaluation     Row Name 11/27/23 1416             Exercise Goal Re-Evaluation   Exercise Goals Review Increase Physical Activity;Understanding of Exercise Prescription;Increase Strength and Stamina;Knowledge and understanding of Target Heart Rate Range (THRR);Able to understand and use rate of perceived exertion (RPE) scale       Comments Pt's first day in the CRP2 program. Pt understands the exercise Rx, RPE scale and THRR.       Expected Outcomes Will continue to montior patient and progress exercise workloads as tolerated.                Discharge Exercise Prescription (Final Exercise Prescription Changes):  Exercise Prescription Changes - 11/27/23 1400       Response to Exercise   Blood Pressure (Admit) 110/58    Blood Pressure (Exercise) 116/64    Blood  Pressure (Exit) 104/52    Heart Rate (Admit) 64 bpm    Heart Rate (Exercise) 74 bpm    Heart Rate (Exit) 58 bpm    Rating of Perceived Exertion (Exercise) 10    Symptoms None    Comments Pt's first day in the CRP2 program    Duration Continue with 30 min of aerobic exercise without signs/symptoms of physical distress.    Intensity THRR unchanged      Progression   Progression Continue to progress workloads to maintain intensity without signs/symptoms of physical distress.    Average METs 4.9      Resistance Training   Training Prescription Yes    Weight 4    Reps 10-15    Time 10 Minutes      Interval Training   Interval Training No      NuStep   Level 2    SPM --   No mets of SPM   Minutes 15      Recumbant Elliptical   Level 2    RPM 71    Watts 105    Minutes 15    METs 4.9             Nutrition:  Target Goals: Understanding of nutrition guidelines, daily intake of sodium 1500mg , cholesterol 200mg , calories 30% from fat and 7% or less from saturated fats, daily to have 5 or more servings of fruits and vegetables.  Biometrics:  Pre Biometrics - 11/22/23 0820       Pre Biometrics   Waist Circumference 37 inches    Hip Circumference 38 inches    Waist to Hip Ratio 0.97 %    Triceps Skinfold 11 mm    % Body Fat 22.7 %    Grip Strength 54 kg    Flexibility 11.25 in   knees bent. with knees straight/flat could not reach   Single Leg Stand 30 seconds              Nutrition Therapy Plan and Nutrition Goals:   Nutrition Assessments:  MEDIFICTS Score Key: >=70 Need to  make dietary changes  40-70 Heart Healthy Diet <= 40 Therapeutic Level Cholesterol Diet   Flowsheet Row INTENSIVE CARDIAC REHAB ORIENT from 11/22/2023 in [facility]  Picture Your Plate Total Score on Admission 82      Picture Your Plate Scores: <16 Unhealthy dietary pattern with much room for improvement. 41-50 Dietary  pattern unlikely to meet recommendations for good health and room for improvement. 51-60 More healthful dietary pattern, with some room for improvement.  >60 Healthy dietary pattern, although there may be some specific behaviors that could be improved.    Nutrition Goals Re-Evaluation:   Nutrition Goals Re-Evaluation:   Nutrition Goals Discharge (Final Nutrition Goals Re-Evaluation):   Psychosocial: Target Goals: Acknowledge presence or absence of significant depression and/or stress, maximize coping skills, provide positive support system. Participant is able to verbalize types and ability to use techniques and skills needed for reducing stress and depression.  Initial Review & Psychosocial Screening:  Initial Psych Review & Screening - 11/22/23 0832       Initial Review   Current issues with None Identified      Family Dynamics   Good Support System? Yes   Jeffrey Liu has his wife for support     Barriers   Psychosocial barriers to participate in program There are no identifiable barriers or psychosocial needs.      Screening Interventions   Interventions Encouraged to exercise;Provide feedback about the scores to participant    Expected Outcomes Short Term goal: Identification and review with participant of any Quality of Life or Depression concerns found by scoring the questionnaire.;Long Term goal: The participant improves quality of Life and PHQ9 Scores as seen by post scores and/or verbalization of changes             Quality of Life Scores:  Quality of Life - 11/22/23 0838       Quality of Life   Select Quality of Life      Quality of Life Scores   Health/Function Pre 28.7 %    Socioeconomic Pre 30 %    Psych/Spiritual Pre 27.14 %    Family Pre 30 %    GLOBAL Pre 28.84 %            Scores of 19 and below usually indicate a poorer quality of life in these areas.  A difference of  2-3 points is a clinically meaningful difference.  A difference of 2-3 points  in the total score of the Quality of Life Index has been associated with significant improvement in overall quality of life, self-image, physical symptoms, and general health in studies assessing change in quality of life.  PHQ-9: Review Flowsheet       11/22/2023  Depression screen PHQ 2/9  Decreased Interest 0  Down, Depressed, Hopeless 0  PHQ - 2 Score 0  Altered sleeping 2  Tired, decreased energy 0  Change in appetite 0  Feeling bad or failure about yourself  0  Trouble concentrating 0  Moving slowly or fidgety/restless 0  Suicidal thoughts 0  PHQ-9 Score 2  Difficult doing work/chores Not difficult at all   Interpretation of Total Score  Total Score Depression Severity:  1-4 = Minimal depression, 5-9 = Mild depression, 10-14 = Moderate depression, 15-19 = Moderately severe depression, 20-27 = Severe depression   Psychosocial Evaluation and Intervention:   Psychosocial Re-Evaluation:  Psychosocial Re-Evaluation     Row Name 11/27/23 [phone]  Psychosocial Re-Evaluation   Current issues with Current Sleep Concerns       Comments Jeffrey Liu says he has difficulty going back to sleep when he wakes up. Jeffrey Liu works on a crossword puzzle to help with this.       Expected Outcomes Jeffrey Liu will have improved sleep habits upon completion of cardiac rehab       Interventions Relaxation education;Stress management education;Encouraged to attend Cardiac Rehabilitation for the exercise       Continue Psychosocial Services  No Follow up required                Psychosocial Discharge (Final Psychosocial Re-Evaluation):  Psychosocial Re-Evaluation - 11/27/23 1603       Psychosocial Re-Evaluation   Current issues with Current Sleep Concerns    Comments Jeffrey Liu says he has difficulty going back to sleep when he wakes up. Jeffrey Liu works on a crossword puzzle to help with this.    Expected Outcomes Jeffrey Liu will have improved sleep habits upon completion of cardiac rehab     Interventions Relaxation education;Stress management education;Encouraged to attend Cardiac Rehabilitation for the exercise    Continue Psychosocial Services  No Follow up required             Vocational Rehabilitation: Provide vocational rehab assistance to qualifying candidates.   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 Participant will state understanding/return demonstration of topics presented.    Education     Row Name 11/27/23 0800     Education   Cardiac Education Topics Pritikin   Select Workshops     Workshops   Educator Nurse   Select Exercise   Exercise Workshop Managing Heart Disease: Your Path to a Healthier Heart   Instruction Review Code 1- Verbalizes Understanding   Class Start Time [phone]   Class Stop Time 0846   Class Time Calculation (min) 35 min    Row Name 11/29/23 1000     Education   Cardiac Education Topics Pritikin   Secondary school teacher School   Educator Dietitian;Respiratory Therapist   Weekly Topic Simple Sides and Sauces   Instruction Review Code 1- Verbalizes Understanding   Class Start Time 0815   Class Stop Time 0847   Class Time Calculation (min) 32 min    Row Name 12/01/23 0800     Education   Cardiac Education Topics Pritikin   Select Core Videos     Core Videos   Educator Exercise Physiologist   Select General Education   General Education Hypertension and Heart Disease   Instruction Review Code 1- Verbalizes Understanding   Class Start Time 0815   Class Stop Time 0849   Class Time Calculation (min) 34 min    Row Name 12/04/23 0800     Education   Cardiac Education  Topics Pritikin   Geographical information systems officer Psychosocial   Psychosocial Workshop From Head to Heart: The Power of a Healthy Outlook   Instruction Review Code 1- Verbalizes Understanding  Core Videos: Exercise    Move It!  Clinical staff conducted group or individual video education with verbal and written material and guidebook.  Patient learns the recommended Pritikin exercise program. Exercise with the goal of living a long, healthy life. Some of the health benefits of exercise include controlled diabetes, healthier blood pressure levels, improved cholesterol levels, improved heart and lung capacity, improved sleep, and better body composition. Everyone should speak with their doctor before starting or changing an exercise routine.  Biomechanical Limitations Clinical staff conducted group or individual video education with verbal and written material and guidebook.  Patient learns how biomechanical limitations can impact exercise and how we can mitigate and possibly overcome limitations to have an impactful and balanced exercise routine.  Body Composition Clinical staff conducted group or individual video education with verbal and written material and guidebook.  Patient learns that body composition (ratio of muscle mass to fat mass) is a key component to assessing overall fitness, rather than body weight alone. Increased fat mass, especially visceral belly fat, can put [place] at increased risk for metabolic syndrome, type 2 diabetes, heart disease, and even death. It is recommended to combine diet and exercise (cardiovascular and resistance training) to improve your body composition. Seek guidance from your physician and exercise physiologist before implementing an exercise routine.  Exercise Action Plan Clinical staff conducted group or individual video education with verbal and written material and guidebook.  Patient learns the  recommended strategies to achieve and enjoy long-term exercise adherence, including variety, self-motivation, self-efficacy, and positive decision making. Benefits of exercise include fitness, good health, weight management, more energy, better sleep, less stress, and overall well-being.  Medical   Heart Disease Risk Reduction Clinical staff conducted group or individual video education with verbal and written material and guidebook.  Patient learns our heart is our most vital organ as it circulates oxygen, nutrients, white blood cells, and hormones throughout the entire body, and carries waste away. Data supports a plant-based eating plan like the Pritikin Program for its effectiveness in slowing progression of and reversing heart disease. The video provides a number of recommendations to address heart disease.   Metabolic Syndrome and Belly Fat  Clinical staff conducted group or individual video education with verbal and written material and guidebook.  Patient learns what metabolic syndrome is, how it leads to heart disease, and how one can reverse it and keep it from coming back. You have metabolic syndrome if you have 3 of the following 5 criteria: abdominal obesity, high blood pressure, high triglycerides, low HDL cholesterol, and high blood sugar.  Hypertension and Heart Disease Clinical staff conducted group or individual video education with verbal and written material and guidebook.  Patient learns that high blood pressure, or hypertension, is very common in the [place]. Hypertension is largely due to excessive salt intake, but other important risk factors include being overweight, physical inactivity, drinking too much alcohol, smoking, and not eating enough potassium from fruits and vegetables. High blood pressure is a leading risk factor for heart attack, stroke, congestive heart failure, dementia, kidney failure, and premature death. Long-term effects of excessive salt intake include  stiffening of the arteries and thickening of heart muscle and organ damage. Recommendations include ways to reduce hypertension and the risk of heart disease.  Diseases of Our Time - Focusing on Diabetes Clinical staff conducted group or individual video education with verbal and written material and guidebook.  Patient learns why the best way to stop diseases of our time  is prevention, through food and other lifestyle changes. Medicine (such as prescription pills and surgeries) is often only a Band-Aid on the problem, not a long-term solution. Most common diseases of our time include obesity, type 2 diabetes, hypertension, heart disease, and cancer. The Pritikin Program is recommended and has been proven to help reduce, reverse, and/or prevent the damaging effects of metabolic syndrome.  Nutrition   Overview of the Pritikin Eating Plan  Clinical staff conducted group or individual video education with verbal and written material and guidebook.  Patient learns about the Pritikin Eating Plan for disease risk reduction. The Pritikin Eating Plan emphasizes a wide variety of unrefined, minimally-processed carbohydrates, like fruits, vegetables, whole grains, and legumes. Go, Caution, and Stop food choices are explained. Plant-based and lean animal proteins are emphasized. Rationale provided for low sodium intake for blood pressure control, low added sugars for blood sugar stabilization, and low added fats and oils for coronary artery disease risk reduction and weight management.  Calorie Density  Clinical staff conducted group or individual video education with verbal and written material and guidebook.  Patient learns about calorie density and how it impacts the Pritikin Eating Plan. Knowing the characteristics of the food you choose will help you decide whether those foods will lead to weight gain or weight loss, and whether you want to consume more or less of them. Weight loss is usually a side effect of  the Pritikin Eating Plan because of its focus on low calorie-dense foods.  Label Reading  Clinical staff conducted group or individual video education with verbal and written material and guidebook.  Patient learns about the Pritikin recommended label reading guidelines and corresponding recommendations regarding calorie density, added sugars, sodium content, and whole grains.  Dining Out - Part 1  Clinical staff conducted group or individual video education with verbal and written material and guidebook.  Patient learns that restaurant meals can be sabotaging because they can be so high in calories, fat, sodium, and/or sugar. Patient learns recommended strategies on how to positively address this and avoid unhealthy pitfalls.  Facts on Fats  Clinical staff conducted group or individual video education with verbal and written material and guidebook.  Patient learns that lifestyle modifications can be just as effective, if not more so, as many medications for lowering your risk of heart disease. A Pritikin lifestyle can help to reduce your risk of inflammation and atherosclerosis (cholesterol build-up, or plaque, in the artery walls). Lifestyle interventions such as dietary choices and physical activity address the cause of atherosclerosis. A review of the types of fats and their impact on blood cholesterol levels, along with dietary recommendations to reduce fat intake is also included.  Nutrition Action Plan  Clinical staff conducted group or individual video education with verbal and written material and guidebook.  Patient learns how to incorporate Pritikin recommendations into their lifestyle. Recommendations include planning and keeping personal health goals in mind as an important part of their success.  Healthy Mind-Set    Healthy Minds, Bodies, Hearts  Clinical staff conducted group or individual video education with verbal and written material and guidebook.  Patient learns how to  identify when they are stressed. Video will discuss the impact of that stress, as well as the many benefits of stress management. Patient will also be introduced to stress management techniques. The way we think, act, and feel has an impact on our hearts.  How Our Thoughts Can Heal Our Hearts  Clinical staff conducted group or individual video  education with verbal and written material and guidebook.  Patient learns that negative thoughts can cause depression and anxiety. This can result in negative lifestyle behavior and serious health problems. Cognitive behavioral therapy is an effective method to help control our thoughts in order to change and improve our emotional outlook.  Additional Videos:  Exercise    Improving Performance  Clinical staff conducted group or individual video education with verbal and written material and guidebook.  Patient learns to use a non-linear approach by alternating intensity levels and lengths of time spent exercising to help burn more calories and lose more body fat. Cardiovascular exercise helps improve heart health, metabolism, hormonal balance, blood sugar control, and recovery from fatigue. Resistance training improves strength, endurance, balance, coordination, reaction time, metabolism, and muscle mass. Flexibility exercise improves circulation, posture, and balance. Seek guidance from your physician and exercise physiologist before implementing an exercise routine and learn your capabilities and proper form for all exercise.  Introduction to Yoga  Clinical staff conducted group or individual video education with verbal and written material and guidebook.  Patient learns about yoga, a discipline of the coming together of mind, breath, and body. The benefits of yoga include improved flexibility, improved range of motion, better posture and core strength, increased lung function, weight loss, and positive self-image. Yoga's heart health benefits include lowered  blood pressure, healthier heart rate, decreased cholesterol and triglyceride levels, improved immune function, and reduced stress. Seek guidance from your physician and exercise physiologist before implementing an exercise routine and learn your capabilities and proper form for all exercise.  Medical   Aging: Enhancing Your Quality of Life  Clinical staff conducted group or individual video education with verbal and written material and guidebook.  Patient learns key strategies and recommendations to stay in good physical health and enhance quality of life, such as prevention strategies, having an advocate, securing a Health Care Proxy and Power of Attorney, and keeping a list of medications and system for tracking them. It also discusses how to avoid risk for bone loss.  Biology of Weight Control  Clinical staff conducted group or individual video education with verbal and written material and guidebook.  Patient learns that weight gain occurs because we consume more calories than we burn (eating more, moving less). Even if your body weight is normal, you may have higher ratios of fat compared to muscle mass. Too much body fat puts you at increased risk for cardiovascular disease, heart attack, stroke, type 2 diabetes, and obesity-related cancers. In addition to exercise, following the Pritikin Eating Plan can help reduce your risk.  Decoding Lab Results  Clinical staff conducted group or individual video education with verbal and written material and guidebook.  Patient learns that lab test reflects one measurement whose values change over time and are influenced by many factors, including medication, stress, sleep, exercise, food, hydration, pre-existing medical conditions, and more. It is recommended to use the knowledge from this video to become more involved with your lab results and evaluate your numbers to speak with your doctor.   Diseases of Our Time - Overview  Clinical staff conducted  group or individual video education with verbal and written material and guidebook.  Patient learns that according to the CDC, 50% to 70% of chronic diseases (such as obesity, type 2 diabetes, elevated lipids, hypertension, and heart disease) are avoidable through lifestyle improvements including healthier food choices, listening to satiety cues, and increased physical activity.  Sleep Disorders Clinical staff conducted group or individual  video education with verbal and written material and guidebook.  Patient learns how good quality and duration of sleep are important to overall health and well-being. Patient also learns about sleep disorders and how they impact health along with recommendations to address them, including discussing with a physician.  Nutrition  Dining Out - Part 2 Clinical staff conducted group or individual video education with verbal and written material and guidebook.  Patient learns how to plan ahead and communicate in order to maximize their dining experience in a healthy and nutritious manner. Included are recommended food choices based on the type of restaurant the patient is visiting.   Fueling a Banker conducted group or individual video education with verbal and written material and guidebook.  There is a strong connection between our food choices and our health. Diseases like obesity and type 2 diabetes are very prevalent and are in large-part due to lifestyle choices. The Pritikin Eating Plan provides plenty of food and hunger-curbing satisfaction. It is easy to follow, affordable, and helps reduce health risks.  Menu Workshop  Clinical staff conducted group or individual video education with verbal and written material and guidebook.  Patient learns that restaurant meals can sabotage health goals because they are often packed with calories, fat, sodium, and sugar. Recommendations include strategies to plan ahead and to communicate with the  manager, chef, or server to help order a healthier meal.  Planning Your Eating Strategy  Clinical staff conducted group or individual video education with verbal and written material and guidebook.  Patient learns about the Pritikin Eating Plan and its benefit of reducing the risk of disease. The Pritikin Eating Plan does not focus on calories. Instead, it emphasizes high-quality, nutrient-rich foods. By knowing the characteristics of the foods, we choose, we can determine their calorie density and make informed decisions.  Targeting Your Nutrition Priorities  Clinical staff conducted group or individual video education with verbal and written material and guidebook.  Patient learns that lifestyle habits have a tremendous impact on disease risk and progression. This video provides eating and physical activity recommendations based on your personal health goals, such as reducing LDL cholesterol, losing weight, preventing or controlling type 2 diabetes, and reducing high blood pressure.  Vitamins and Minerals  Clinical staff conducted group or individual video education with verbal and written material and guidebook.  Patient learns different ways to obtain key vitamins and minerals, including through a recommended healthy diet. It is important to discuss all supplements you take with your doctor.   Healthy Mind-Set    Smoking Cessation  Clinical staff conducted group or individual video education with verbal and written material and guidebook.  Patient learns that cigarette smoking and tobacco addiction pose a serious health risk which affects millions of people. Stopping smoking will significantly reduce the risk of heart disease, lung disease, and many forms of cancer. Recommended strategies for quitting are covered, including working with your doctor to develop a successful plan.  Culinary   Becoming a Set designer conducted group or individual video education with verbal and  written material and guidebook.  Patient learns that cooking at home can be healthy, cost-effective, quick, and puts them in control. Keys to cooking healthy recipes will include looking at your recipe, assessing your equipment needs, planning ahead, making it simple, choosing cost-effective seasonal ingredients, and limiting the use of added fats, salts, and sugars.  Cooking - Breakfast and Snacks  Clinical staff conducted group or individual  video education with verbal and written material and guidebook.  Patient learns how important breakfast is to satiety and nutrition through the entire day. Recommendations include key foods to eat during breakfast to help stabilize blood sugar levels and to prevent overeating at meals later in the day. Planning ahead is also a key component.  Cooking - Educational psychologist conducted group or individual video education with verbal and written material and guidebook.  Patient learns eating strategies to improve overall health, including an approach to cook more at home. Recommendations include thinking of animal protein as a side on your plate rather than center stage and focusing instead on lower calorie dense options like vegetables, fruits, whole grains, and plant-based proteins, such as beans. Making sauces in large quantities to freeze for later and leaving the skin on your vegetables are also recommended to maximize your experience.  Cooking - Healthy Salads and Dressing Clinical staff conducted group or individual video education with verbal and written material and guidebook.  Patient learns that vegetables, fruits, whole grains, and legumes are the foundations of the Pritikin Eating Plan. Recommendations include how to incorporate each of these in flavorful and healthy salads, and how to create homemade salad dressings. Proper handling of ingredients is also covered. Cooking - Soups and [employer] - Soups and Desserts Clinical staff  conducted group or individual video education with verbal and written material and guidebook.  Patient learns that Pritikin soups and desserts make for easy, nutritious, and delicious snacks and meal components that are low in sodium, fat, sugar, and calorie density, while high in vitamins, minerals, and filling fiber. Recommendations include simple and healthy ideas for soups and desserts.   Overview     The Pritikin Solution Program Overview Clinical staff conducted group or individual video education with verbal and written material and guidebook.  Patient learns that the results of the Pritikin Program have been documented in more than 100 articles published in peer-reviewed journals, and the benefits include reducing risk factors for (and, in some cases, even reversing) high cholesterol, high blood pressure, type 2 diabetes, obesity, and more! An overview of the three key pillars of the Pritikin Program will be covered: eating well, doing regular exercise, and having a healthy mind-set.  WORKSHOPS  Exercise: Exercise Basics: Building Your Action Plan Clinical staff led group instruction and group discussion with PowerPoint presentation and patient guidebook. To enhance the learning environment the use of posters, models and videos may be added. At the conclusion of this workshop, patients will comprehend the difference between physical activity and exercise, as well as the benefits of incorporating both, into their routine. Patients will understand the FITT (Frequency, Intensity, Time, and Type) principle and how to use it to build an exercise action plan. In addition, safety concerns and other considerations for exercise and cardiac rehab will be addressed by the presenter. The purpose of this lesson is to promote a comprehensive and effective weekly exercise routine in order to improve patients' overall level of fitness.   Managing Heart Disease: Your Path to a Healthier Heart Clinical  staff led group instruction and group discussion with PowerPoint presentation and patient guidebook. To enhance the learning environment the use of posters, models and videos may be added.At the conclusion of this workshop, patients will understand the anatomy and physiology of the heart. Additionally, they will understand how Pritikin's three pillars impact the risk factors, the progression, and the management of heart disease.  The purpose of  this lesson is to provide a high-level overview of the heart, heart disease, and how the Pritikin lifestyle positively impacts risk factors.  Exercise Biomechanics Clinical staff led group instruction and group discussion with PowerPoint presentation and patient guidebook. To enhance the learning environment the use of posters, models and videos may be added. Patients will learn how the structural parts of their bodies function and how these functions impact their daily activities, movement, and exercise. Patients will learn how to promote a neutral spine, learn how to manage pain, and identify ways to improve their physical movement in order to promote healthy living. The purpose of this lesson is to expose patients to common physical limitations that impact physical activity. Participants will learn practical ways to adapt and manage aches and pains, and to minimize their effect on regular exercise. Patients will learn how to maintain good posture while sitting, walking, and lifting.  Balance Training and Fall Prevention  Clinical staff led group instruction and group discussion with PowerPoint presentation and patient guidebook. To enhance the learning environment the use of posters, models and videos may be added. At the conclusion of this workshop, patients will understand the importance of their sensorimotor skills (vision, proprioception, and the vestibular system) in maintaining their ability to balance as they age. Patients will apply a variety of  balancing exercises that are appropriate for their current level of function. Patients will understand the common causes for poor balance, possible solutions to these problems, and ways to modify their physical environment in order to minimize their fall risk. The purpose of this lesson is to teach patients about the importance of maintaining balance as they age and ways to minimize their risk of falling.  WORKSHOPS   Nutrition:  Fueling a Ship broker led group instruction and group discussion with PowerPoint presentation and patient guidebook. To enhance the learning environment the use of posters, models and videos may be added. Patients will review the foundational principles of the Pritikin Eating Plan and understand what constitutes a serving size in each of the food groups. Patients will also learn Pritikin-friendly foods that are better choices when away from home and review make-ahead meal and snack options. Calorie density will be reviewed and applied to three nutrition priorities: weight maintenance, weight loss, and weight gain. The purpose of this lesson is to reinforce (in a group setting) the key concepts around what patients are recommended to eat and how to apply these guidelines when away from home by planning and selecting Pritikin-friendly options. Patients will understand how calorie density may be adjusted for different weight management goals.  Mindful Eating  Clinical staff led group instruction and group discussion with PowerPoint presentation and patient guidebook. To enhance the learning environment the use of posters, models and videos may be added. Patients will briefly review the concepts of the Pritikin Eating Plan and the importance of low-calorie dense foods. The concept of mindful eating will be introduced as well as the importance of paying attention to internal hunger signals. Triggers for non-hunger eating and techniques for dealing with triggers will be  explored. The purpose of this lesson is to provide patients with the opportunity to review the basic principles of the Pritikin Eating Plan, discuss the value of eating mindfully and how to measure internal cues of hunger and fullness using the Hunger Scale. Patients will also discuss reasons for non-hunger eating and learn strategies to use for controlling emotional eating.  Targeting Your Nutrition Priorities Clinical staff led group instruction  and group discussion with PowerPoint presentation and patient guidebook. To enhance the learning environment the use of posters, models and videos may be added. Patients will learn how to determine their genetic susceptibility to disease by reviewing their family history. Patients will gain insight into the importance of diet as part of an overall healthy lifestyle in mitigating the impact of genetics and other environmental insults. The purpose of this lesson is to provide patients with the opportunity to assess their personal nutrition priorities by looking at their family history, their own health history and current risk factors. Patients will also be able to discuss ways of prioritizing and modifying the Pritikin Eating Plan for their highest risk areas  Menu  Clinical staff led group instruction and group discussion with PowerPoint presentation and patient guidebook. To enhance the learning environment the use of posters, models and videos may be added. Using menus brought in from [employer], or printed from [employer], patients will apply the Pritikin dining out guidelines that were presented in the [employer] video. Patients will also be able to practice these guidelines in a variety of provided scenarios. The purpose of this lesson is to provide patients with the opportunity to practice hands-on learning of the Pritikin Dining Out guidelines with actual menus and practice scenarios.  Label Reading Clinical staff led group  instruction and group discussion with PowerPoint presentation and patient guidebook. To enhance the learning environment the use of posters, models and videos may be added. Patients will review and discuss the Pritikin label reading guidelines presented in Pritikin's Label Reading Educational series video. Using fool labels brought in from local grocery stores and markets, patients will apply the label reading guidelines and determine if the packaged food meet the Pritikin guidelines. The purpose of this lesson is to provide patients with the opportunity to review, discuss, and practice hands-on learning of the Pritikin Label Reading guidelines with actual packaged food labels. Cooking School  Pritikin's [employer] are designed to teach patients ways to prepare quick, simple, and affordable recipes at home. The importance of nutrition's role in chronic disease risk reduction is reflected in its emphasis in the overall Pritikin program. By learning how to prepare essential core Pritikin Eating Plan recipes, patients will increase control over what they eat; be able to customize the flavor of foods without the use of added salt, sugar, or fat; and improve the quality of the food they consume. By learning a set of core recipes which are easily assembled, quickly prepared, and affordable, patients are more likely to prepare more healthy foods at home. These workshops focus on convenient breakfasts, simple entres, side dishes, and desserts which can be prepared with minimal effort and are consistent with nutrition recommendations for cardiovascular risk reduction. Cooking [employer] are taught by a Armed forces logistics/support/administrative officer (RD) who has been trained by the [employer]. The chef or RD has a clear understanding of the importance of minimizing - if not completely eliminating - added fat, sugar, and sodium in recipes. Throughout the series of Cooking School Workshop sessions, patients  will learn about healthy ingredients and efficient methods of cooking to build confidence in their capability to prepare    Cooking School weekly topics:  Adding Flavor- Sodium-Free  Fast and Healthy Breakfasts  Powerhouse Plant-Based Proteins  Satisfying Salads and Dressings  Simple Sides and Sauces  International Cuisine-Spotlight on the [employer] Zones  Delicious Desserts  Savory Soups  [employer] - Meals in  a Snap  Tasty Appetizers and Snacks  Comforting Weekend Breakfasts  One-Pot Wonders   Fast Evening Meals  Landscape architect Your Pritikin Plate  WORKSHOPS   Healthy Mindset (Psychosocial):  Focused Goals, Sustainable Changes Clinical staff led group instruction and group discussion with PowerPoint presentation and patient guidebook. To enhance the learning environment the use of posters, models and videos may be added. Patients will be able to apply effective goal setting strategies to establish at least one personal goal, and then take consistent, meaningful action toward that goal. They will learn to identify common barriers to achieving personal goals and develop strategies to overcome them. Patients will also gain an understanding of how our mind-set can impact our ability to achieve goals and the importance of cultivating a positive and growth-oriented mind-set. The purpose of this lesson is to provide patients with a deeper understanding of how to set and achieve personal goals, as well as the tools and strategies needed to overcome common obstacles which may arise along the way.  From Head to Heart: The Power of a Healthy Outlook  Clinical staff led group instruction and group discussion with PowerPoint presentation and patient guidebook. To enhance the learning environment the use of posters, models and videos may be added. Patients will be able to recognize and describe the impact of emotions and mood on physical health. They will discover the importance  of self-care and explore self-care practices which may work for them. Patients will also learn how to utilize the 4 C's to cultivate a healthier outlook and better manage stress and challenges. The purpose of this lesson is to demonstrate to patients how a healthy outlook is an essential part of maintaining good health, especially as they continue their cardiac rehab journey.  Healthy Sleep for a Healthy Heart Clinical staff led group instruction and group discussion with PowerPoint presentation and patient guidebook. To enhance the learning environment the use of posters, models and videos may be added. At the conclusion of this workshop, patients will be able to demonstrate knowledge of the importance of sleep to overall health, well-being, and quality of life. They will understand the symptoms of, and treatments for, common sleep disorders. Patients will also be able to identify daytime and nighttime behaviors which impact sleep, and they will be able to apply these tools to help manage sleep-related challenges. The purpose of this lesson is to provide patients with a general overview of sleep and outline the importance of quality sleep. Patients will learn about a few of the most common sleep disorders. Patients will also be introduced to the concept of "sleep hygiene," and discover ways to self-manage certain sleeping problems through simple daily behavior changes. Finally, the workshop will motivate patients by clarifying the links between quality sleep and their goals of heart-healthy living.   Recognizing and Reducing Stress Clinical staff led group instruction and group discussion with PowerPoint presentation and patient guidebook. To enhance the learning environment the use of posters, models and videos may be added. At the conclusion of this workshop, patients will be able to understand the types of stress reactions, differentiate between acute and chronic stress, and recognize the impact that  chronic stress has on their health. They will also be able to apply different coping mechanisms, such as reframing negative self-talk. Patients will have the opportunity to practice a variety of stress management techniques, such as deep abdominal breathing, progressive muscle relaxation, and/or guided imagery.  The purpose of this lesson is to educate  patients on the role of stress in their lives and to provide healthy techniques for coping with it.  Learning Barriers/Preferences:  Learning Barriers/Preferences - 11/22/23 6295       Learning Barriers/Preferences   Learning Barriers Sight   wears glasses   Learning Preferences Audio;Computer/Internet;Group Instruction;Individual Instruction;Skilled Demonstration;Pictoral;Verbal Instruction;Video;Written Material             Education Topics:  Knowledge Questionnaire Score:  Knowledge Questionnaire Score - 11/22/23 0834       Knowledge Questionnaire Score   Pre Score 24/24             Core Components/Risk Factors/Patient Goals at Admission:  Personal Goals and Risk Factors at Admission - 11/22/23 0831       Core Components/Risk Factors/Patient Goals on Admission    Weight Management Yes;Weight Maintenance    Intervention Weight Management: Develop a combined nutrition and exercise program designed to reach desired caloric intake, while maintaining appropriate intake of nutrient and fiber, sodium and fats, and appropriate energy expenditure required for the weight goal.;Weight Management: Provide education and appropriate resources to help participant work on and attain dietary goals.    Expected Outcomes Short Term: Continue to assess and modify interventions until short term weight is achieved;Long Term: Adherence to nutrition and physical activity/exercise program aimed toward attainment of established weight goal;Weight Maintenance: Understanding of the daily nutrition guidelines, which includes 25-35% calories from fat, 7% or  less cal from saturated fats, less than 200mg  cholesterol, less than 1.5gm of sodium, & 5 or more servings of fruits and vegetables daily;Understanding recommendations for meals to include 15-35% energy as protein, 25-35% energy from fat, 35-60% energy from carbohydrates, less than 200mg  of dietary cholesterol, 20-35 gm of total fiber daily;Understanding of distribution of calorie intake throughout the day with the consumption of 4-5 meals/snacks    Hypertension Yes    Intervention Provide education on lifestyle modifcations including regular physical activity/exercise, weight management, moderate sodium restriction and increased consumption of fresh fruit, vegetables, and low fat dairy, alcohol moderation, and smoking cessation.;Monitor prescription use compliance.    Expected Outcomes Short Term: Continued assessment and intervention until BP is < 140/79mm HG in hypertensive participants. < 130/80mm HG in hypertensive participants with diabetes, heart failure or chronic kidney disease.;Long Term: Maintenance of blood pressure at goal levels.             Core Components/Risk Factors/Patient Goals Review:   Goals and Risk Factor Review     Row Name 11/28/23 0740 11/30/23 0903           Core Components/Risk Factors/Patient Goals Review   Personal Goals Review Weight Management/Obesity;Hypertension Weight Management/Obesity;Hypertension      Review Pal started cardiac rehab on 11/28/23. Jeffrey Liu did well with exercise. Vital signs were stable. Jeffrey Liu started cardiac rehab on 11/28/23. Jeffrey Liu is off to a good start  with exercise. Vital signs have been stable.      Expected Outcomes Jeffrey Liu will continue to participate in cardiac rehab for exercise, nutrition and lifestyle modifications. Jeffrey Liu will continue to participate in cardiac rehab for exercise, nutrition and lifestyle modifications.               Core Components/Risk Factors/Patient Goals at Discharge (Final Review):   Goals and Risk  Factor Review - 11/30/23 0903       Core Components/Risk Factors/Patient Goals Review   Personal Goals Review Weight Management/Obesity;Hypertension    Review Jeffrey Liu started cardiac rehab on 11/28/23. Jeffrey Liu is off to a good start  with exercise. Vital signs have been stable.    Expected Outcomes Jeffrey Liu will continue to participate in cardiac rehab for exercise, nutrition and lifestyle modifications.             ITP Comments:  ITP Comments     Row Name 11/22/23 0744 11/27/23 1600 11/30/23 0903       ITP Comments Dr. Armanda Magic medical director. Introduction to pritikin education/intensive cardiac rehab. Initial orientation packet reviewed with patient. 30 Day ITP Review. Ashar started cardiac rehab on 11/27/23. Jenna did well with exercise. 30 Day ITP Review. Romar started cardiac rehab on 11/27/23. Emmanuelle is off to a good start  with exercise.              Comments: See ITP comments.Thayer Headings RN BSN

## 2023-12-01 ENCOUNTER — Encounter (HOSPITAL_COMMUNITY)
Admission: RE | Admit: 2023-12-01 | Discharge: 2023-12-01 | Disposition: A | Discharge status: Home / Self Care | Payer: Medicare HMO | Source: Ambulatory Visit | Attending: Cardiology | Admitting: Cardiology

## 2023-12-01 ENCOUNTER — Encounter (HOSPITAL_COMMUNITY): Payer: Medicare HMO

## 2023-12-01 DIAGNOSIS — Z9889 Other specified postprocedural states: Secondary | ICD-10-CM | POA: Diagnosis not present

## 2023-12-01 DIAGNOSIS — I4891 Unspecified atrial fibrillation: Secondary | ICD-10-CM | POA: Diagnosis not present

## 2023-12-01 DIAGNOSIS — I9789 Other postprocedural complications and disorders of the circulatory system, not elsewhere classified: Secondary | ICD-10-CM | POA: Diagnosis not present

## 2023-12-04 ENCOUNTER — Encounter (HOSPITAL_COMMUNITY)
Admission: RE | Admit: 2023-12-04 | Discharge: 2023-12-04 | Disposition: A | Discharge status: Home / Self Care | Payer: Medicare HMO | Source: Ambulatory Visit | Attending: Cardiology | Admitting: Cardiology

## 2023-12-04 ENCOUNTER — Encounter (HOSPITAL_COMMUNITY): Payer: Medicare HMO

## 2023-12-04 DIAGNOSIS — I4892 Unspecified atrial flutter: Secondary | ICD-10-CM | POA: Insufficient documentation

## 2023-12-04 DIAGNOSIS — Z48812 Encounter for surgical aftercare following surgery on the circulatory system: Secondary | ICD-10-CM | POA: Insufficient documentation

## 2023-12-04 DIAGNOSIS — Z9889 Other specified postprocedural states: Secondary | ICD-10-CM | POA: Diagnosis present

## 2023-12-06 ENCOUNTER — Encounter (HOSPITAL_COMMUNITY)
Admission: RE | Admit: 2023-12-06 | Discharge: 2023-12-06 | Disposition: A | Discharge status: Home / Self Care | Payer: Medicare HMO | Source: Ambulatory Visit | Attending: Cardiology

## 2023-12-06 ENCOUNTER — Encounter (HOSPITAL_COMMUNITY): Payer: Medicare HMO

## 2023-12-06 DIAGNOSIS — Z48812 Encounter for surgical aftercare following surgery on the circulatory system: Secondary | ICD-10-CM | POA: Diagnosis not present

## 2023-12-06 DIAGNOSIS — Z9889 Other specified postprocedural states: Secondary | ICD-10-CM

## 2023-12-08 ENCOUNTER — Encounter (HOSPITAL_COMMUNITY): Payer: Medicare HMO

## 2023-12-11 ENCOUNTER — Telehealth: Payer: Self-pay | Admitting: Nurse Practitioner

## 2023-12-11 ENCOUNTER — Encounter (HOSPITAL_COMMUNITY)
Admission: RE | Admit: 2023-12-11 | Discharge: 2023-12-11 | Disposition: A | Discharge status: Home / Self Care | Payer: Medicare HMO | Source: Ambulatory Visit | Attending: Cardiology | Admitting: Cardiology

## 2023-12-11 ENCOUNTER — Encounter (HOSPITAL_COMMUNITY): Payer: Medicare HMO

## 2023-12-11 DIAGNOSIS — Z01812 Encounter for preprocedural laboratory examination: Secondary | ICD-10-CM

## 2023-12-11 DIAGNOSIS — Z48812 Encounter for surgical aftercare following surgery on the circulatory system: Secondary | ICD-10-CM | POA: Diagnosis not present

## 2023-12-11 DIAGNOSIS — Z9889 Other specified postprocedural states: Secondary | ICD-10-CM | POA: Diagnosis not present

## 2023-12-11 DIAGNOSIS — I4892 Unspecified atrial flutter: Secondary | ICD-10-CM

## 2023-12-11 NOTE — Telephone Encounter (Signed)
 Pt will need updated H&P.  Scheduled 2/11 at 2pm per Dr [name].  Orders placed for CBC, BMP.

## 2023-12-11 NOTE — Progress Notes (Addendum)
 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   Patient placed on monitor. Appeared to be in Atrial Flutter rate 50's. Non sustained heart rate noted in the 40's.  Vital sign are as follows. Sitting blood pressure 92/60 Standing blood pressure 96/60. Oxygen saturation 100% on room air. Patient asymptomatic.   Onsite provider [name] NP notified. 12 lead ordered and obtained. 12 lead confirmed Atrial flutter. [name] came and spoke with Jeffrey Liu.  Dr [name] notified by [name]. DCCV and EP consult requested.  [name] said that Jeffrey Liu can resume exercise on his next scheduled visit.  Patient aware and agreeable to the plan.   [name] left cardiac rehab without complaints or symptoms.   Exit BP 102/64 heart rate 59.  [name] RN BSN

## 2023-12-11 NOTE — Telephone Encounter (Signed)
 Jeffrey Liu,  Patient found to be in rate controlled atrial flutter during cardiac rehab today. He will remain here to complete exercise regimen today. He is currently anticoagulated on Eliquis  5 mg BID.   Per Dr. [name], we will plan for DCCV and refer to EP.  I am forwarding to Dr. [name]' RN for further management.  Please let me know if you have any questions.  [name]

## 2023-12-11 NOTE — Telephone Encounter (Signed)
 Received verbal orders from Dr [name] to schedule pt for outpt cardioversion (CPT [phone]) - At Flutter.  Orders placed for CBC,BMP and referral to EP as instructed. Will contact pt to determine when he would like to have procedure completed.

## 2023-12-12 ENCOUNTER — Encounter: Payer: Self-pay | Admitting: Cardiology

## 2023-12-12 ENCOUNTER — Other Ambulatory Visit: Payer: Self-pay | Admitting: *Deleted

## 2023-12-12 ENCOUNTER — Ambulatory Visit: Payer: Medicare HMO | Attending: Cardiology | Admitting: Cardiology

## 2023-12-12 VITALS — BP 120/68 | HR 59 | Ht 76.0 in | Wt 191.0 lb

## 2023-12-12 DIAGNOSIS — I7121 Aneurysm of the ascending aorta, without rupture: Secondary | ICD-10-CM

## 2023-12-12 DIAGNOSIS — Z9889 Other specified postprocedural states: Secondary | ICD-10-CM | POA: Diagnosis not present

## 2023-12-12 DIAGNOSIS — I4892 Unspecified atrial flutter: Secondary | ICD-10-CM | POA: Diagnosis not present

## 2023-12-12 DIAGNOSIS — Z01812 Encounter for preprocedural laboratory examination: Secondary | ICD-10-CM

## 2023-12-12 NOTE — H&P (View-Only) (Signed)
Cardiology Office Note:  .   Date:  12/12/2023  ID:  Jeffrey Galea, MD, DOB 08/19/1956, MRN [id number] PCP: Kirby Funk, MD (Inactive)  [facility] Providers Cardiologist:  Donato Schultz, MD     History of Present Illness: .   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 Postoperatively, he developed atrial fibrillation, initially managed with amiodarone and DCCV, which was discontinued due to bradycardia with heart rates in the thirties to forties at night. Metoprolol was reduced to 12.5 mg once daily due to orthostatic symptoms.  He is currently experiencing atrial flutter, which started on Saturday, with 'fluttering' sensations and increased shortness of breath. He also experiences orthostatic symptoms, needing to bend over when standing up, and feels lightheaded after climbing stairs. His heart rate has been noted to be slow, at times in the fifties, and occasionally dropping to the forties.  He is on Eliquis 5 mg twice daily, aspirin 81 mg daily, and metoprolol 12.5 mg daily. He also takes Crestor 10 mg twice a week. He has not missed any doses of Eliquis.  He has been participating in cardiac rehab and finds it beneficial. He is able to exercise up to eight METs and enjoys sweating during workouts. He notes that his heart rate was in the fifties in the morning and increased to ninety after getting up, which he found unusual.           Studies Reviewed: Marland Kitchen   EKG Interpretation Date/Time:  Tuesday December 12 2023 14:15:24 EST Ventricular Rate:  59 PR Interval:    QRS Duration:  98 QT Interval:  456 QTC Calculation: 451 R Axis:   61  Text Interpretation: Atrial flutter Nonspecific ST and T wave abnormality When compared with  ECG of 11-Dec-2023 09:18, No significant change since last tracing Confirmed by Donato Schultz ([id number]) on 12/12/2023 2:26:31 PM    Results   DIAGNOSTIC EKG: Atrial flutter with a heart rate of 59-60 bpm     Risk Assessment/Calculations:            Physical Exam:   VS:  BP 120/68   Pulse (!) 59   Ht 6\' 4"  (1.93 m)   Wt 191 lb (86.6 kg)   SpO2 96%   BMI 23.25 kg/m    Wt Readings from Last 3 Encounters:  12/12/23 191 lb (86.6 kg)  11/22/23 191 lb 12.8 oz (87 kg)  11/06/23 187 lb 12.8 oz (85.2 kg)    GEN: Well nourished, well developed in no acute distress NECK: No JVD; No carotid bruits CARDIAC: brady fairly regular, no murmurs, no rubs, no gallops RESPIRATORY:  Clear to auscultation without rales, wheezing or rhonchi  ABDOMEN: Soft, non-tender, non-distended EXTREMITIES:  No edema; No deformity   ASSESSMENT AND PLAN: .    Assessment and Plan    Atrial Flutter/Fibrillation 68 year old male with recurrent atrial flutter post-aortic root replacement and valve resuspension. Symptoms include dyspnea and orthostatic hypotension. Amiodarone was discontinued due to bradycardia. Currently on metoprolol 12.5 mg daily, Eliquis 5 mg twice daily, and aspirin 81 mg daily. Postoperative inflammation may contribute to arrhythmias. Discussed cardioversion risks and benefits, including low pacemaker or stroke risk due to consistent Eliquis use. High likelihood of successful conversion,  especially with atrial flutter. Potential need for EP evaluation for ablation discussed. - Discontinue metoprolol 12.5 mg daily due to bradycardia and orthostatic hypotension. Continue compression hose.  - Schedule cardioversion without TEE due to consistent Eliquis use. - Refer to electrophysiology (EP) specialist for further evaluation and management of atrial flutter.  Postoperative Care Ninth week post-aortic root replacement and valve resuspension. Recovery is variable with periods of improvement and decline.  Engaged in cardiac rehab with good exercise tolerance up to 8 METs. - Continue participation in cardiac rehab.  General Health Maintenance On Crestor 10 mg twice a week for cholesterol management. - Continue Crestor 10 mg twice a week. Will follow LDL.  Follow-up - Has follow-up appointment in March. - Coordinate cardioversion and EP referral with Pam.           Informed Consent   Shared Decision Making/Informed Consent The risks (stroke, cardiac arrhythmias rarely resulting in the need for a temporary or permanent pacemaker, skin irritation or burns and complications associated with conscious sedation including aspiration, arrhythmia, respiratory failure and death), benefits (restoration of normal sinus rhythm) and alternatives of a direct current cardioversion were explained in detail to Mr. Jeffrey Liu and he agrees to proceed.         Signed, Donato Schultz, MD

## 2023-12-12 NOTE — Addendum Note (Signed)
Addended by: Jake Bathe on: 12/12/2023 04:16 PM   Modules accepted: Orders

## 2023-12-12 NOTE — Progress Notes (Signed)
Cardiology Office Note:  .   Date:  12/12/2023  ID:  Elpidio Galea, MD, DOB 08/19/1956, MRN [id number] PCP: Kirby Funk, MD (Inactive)  [facility] Providers Cardiologist:  Donato Schultz, MD     History of Present Illness: .   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 Postoperatively, he developed atrial fibrillation, initially managed with amiodarone and DCCV, which was discontinued due to bradycardia with heart rates in the thirties to forties at night. Metoprolol was reduced to 12.5 mg once daily due to orthostatic symptoms.  He is currently experiencing atrial flutter, which started on Saturday, with 'fluttering' sensations and increased shortness of breath. He also experiences orthostatic symptoms, needing to bend over when standing up, and feels lightheaded after climbing stairs. His heart rate has been noted to be slow, at times in the fifties, and occasionally dropping to the forties.  He is on Eliquis 5 mg twice daily, aspirin 81 mg daily, and metoprolol 12.5 mg daily. He also takes Crestor 10 mg twice a week. He has not missed any doses of Eliquis.  He has been participating in cardiac rehab and finds it beneficial. He is able to exercise up to eight METs and enjoys sweating during workouts. He notes that his heart rate was in the fifties in the morning and increased to ninety after getting up, which he found unusual.           Studies Reviewed: Marland Kitchen   EKG Interpretation Date/Time:  Tuesday December 12 2023 14:15:24 EST Ventricular Rate:  59 PR Interval:    QRS Duration:  98 QT Interval:  456 QTC Calculation: 451 R Axis:   61  Text Interpretation: Atrial flutter Nonspecific ST and T wave abnormality When compared with  ECG of 11-Dec-2023 09:18, No significant change since last tracing Confirmed by Donato Schultz ([id number]) on 12/12/2023 2:26:31 PM    Results   DIAGNOSTIC EKG: Atrial flutter with a heart rate of 59-60 bpm     Risk Assessment/Calculations:            Physical Exam:   VS:  BP 120/68   Pulse (!) 59   Ht 6\' 4"  (1.93 m)   Wt 191 lb (86.6 kg)   SpO2 96%   BMI 23.25 kg/m    Wt Readings from Last 3 Encounters:  12/12/23 191 lb (86.6 kg)  11/22/23 191 lb 12.8 oz (87 kg)  11/06/23 187 lb 12.8 oz (85.2 kg)    GEN: Well nourished, well developed in no acute distress NECK: No JVD; No carotid bruits CARDIAC: brady fairly regular, no murmurs, no rubs, no gallops RESPIRATORY:  Clear to auscultation without rales, wheezing or rhonchi  ABDOMEN: Soft, non-tender, non-distended EXTREMITIES:  No edema; No deformity   ASSESSMENT AND PLAN: .    Assessment and Plan    Atrial Flutter/Fibrillation 68 year old male with recurrent atrial flutter post-aortic root replacement and valve resuspension. Symptoms include dyspnea and orthostatic hypotension. Amiodarone was discontinued due to bradycardia. Currently on metoprolol 12.5 mg daily, Eliquis 5 mg twice daily, and aspirin 81 mg daily. Postoperative inflammation may contribute to arrhythmias. Discussed cardioversion risks and benefits, including low pacemaker or stroke risk due to consistent Eliquis use. High likelihood of successful conversion,  especially with atrial flutter. Potential need for EP evaluation for ablation discussed. - Discontinue metoprolol 12.5 mg daily due to bradycardia and orthostatic hypotension. Continue compression hose.  - Schedule cardioversion without TEE due to consistent Eliquis use. - Refer to electrophysiology (EP) specialist for further evaluation and management of atrial flutter.  Postoperative Care Ninth week post-aortic root replacement and valve resuspension. Recovery is variable with periods of improvement and decline.  Engaged in cardiac rehab with good exercise tolerance up to 8 METs. - Continue participation in cardiac rehab.  General Health Maintenance On Crestor 10 mg twice a week for cholesterol management. - Continue Crestor 10 mg twice a week. Will follow LDL.  Follow-up - Has follow-up appointment in March. - Coordinate cardioversion and EP referral with Pam.           Informed Consent   Shared Decision Making/Informed Consent The risks (stroke, cardiac arrhythmias rarely resulting in the need for a temporary or permanent pacemaker, skin irritation or burns and complications associated with conscious sedation including aspiration, arrhythmia, respiratory failure and death), benefits (restoration of normal sinus rhythm) and alternatives of a direct current cardioversion were explained in detail to [name] and he agrees to proceed.         Signed, Donato Schultz, MD

## 2023-12-12 NOTE — Patient Instructions (Addendum)
Medication Instructions:  Please discontinue your Metoprolol. Continue all other medications as listed.  *If you need a refill on your cardiac medications before your next appointment, please call your pharmacy*  Lab Work: Please have blood work today (CBC,BMP) today.  If you have labs (blood work) drawn today and your tests are completely normal, you will receive your results only by: MyChart Message (if you have MyChart) OR A paper copy in the mail If you have any lab test that is abnormal or we need to change your treatment, we will call you to review the results.  Testing/Procedures: Your physician has requested that you have a Cardioversion.  Electrical Cardioversion uses a jolt of electricity to your heart either through paddles or wired patches attached to your chest. This is a controlled, usually prescheduled, procedure. This procedure is done at the hospital and you are not awake during the procedure. You usually go home the day of the procedure. Please see the instruction sheet given to you today for more information.      Dear Elpidio Galea, MD  You are scheduled for a Cardioversion on Friday, February 14 with Dr. Flora Lipps.  Please arrive at the [facility] (Main Entrance A) at [facility]: [address] [place], [place] [address] at 7:00 AM (This time is 1 hour(s) before your procedure to ensure your preparation).   Free valet parking service is available. You will check in at ADMITTING.   *Please Note: You will receive a call the day before your procedure to confirm the appointment time. That time may have changed from the original time based on the schedule for that day.*   DIET:  Nothing to eat or drink after midnight except a sip of water with medications (see medication instructions below)  MEDICATION INSTRUCTIONS: Continue taking your anticoagulant (blood thinner): Apixaban (Eliquis).  You will need to continue this after your procedure until you are told by  your provider that it is safe to stop.    LABS:  Come to [address]  FYI:  For your safety, and to allow [place] to monitor your vital signs accurately during the surgery/procedure we request: If you have artificial nails, gel coating, SNS etc, please have those removed prior to your surgery/procedure. Not having the nail coverings /polish removed may result in cancellation or delay of your surgery/procedure.  Your support person will be asked to wait in the waiting room during your procedure.  It is OK to have someone drop you off and come back when you are ready to be discharged.  You cannot drive after the procedure and will need someone to drive you home.  Bring your insurance cards.  *Special Note: Every effort is made to have your procedure done on time. Occasionally there are emergencies that occur at the hospital that may cause delays. Please be patient if a delay does occur.         Follow-Up: At [facility], you and your health needs are our priority.  As part of our continuing mission to provide you with exceptional heart care, we have created designated Provider Care Teams.  These Care Teams include your primary Cardiologist (physician) and Advanced Practice Providers (APPs -  Physician Assistants and Nurse Practitioners) who all work together to provide you with the care you need, when you need it.  We recommend signing up for the patient portal called "MyChart".  Sign up information is provided on this After Visit Summary.  MyChart is used to connect with patients for Virtual Visits (Telemedicine).  Patients are able to view lab/test results, encounter notes, upcoming appointments, etc.  Non-urgent messages can be sent to your provider as well.   To learn more about what you can do with MyChart, go to [email or web address].    Your next appointment:   Follow up as scheduled in March.

## 2023-12-13 ENCOUNTER — Telehealth (HOSPITAL_COMMUNITY): Payer: Self-pay | Admitting: *Deleted

## 2023-12-13 ENCOUNTER — Encounter (HOSPITAL_COMMUNITY): Admission: RE | Admit: 2023-12-13 | Payer: Medicare HMO | Source: Ambulatory Visit

## 2023-12-13 ENCOUNTER — Encounter: Payer: Self-pay | Admitting: Cardiology

## 2023-12-13 ENCOUNTER — Encounter (HOSPITAL_COMMUNITY): Payer: Medicare HMO

## 2023-12-13 LAB — BASIC METABOLIC PANEL
BUN/Creatinine Ratio: 24 (ref 10–24)
BUN: 21 mg/dL (ref 8–27)
CO2: 24 mmol/L (ref 20–29)
Calcium: 9 mg/dL (ref 8.6–10.2)
Chloride: 101 mmol/L (ref 96–106)
Creatinine, Ser: 0.87 mg/dL (ref 0.76–1.27)
Glucose: 96 mg/dL (ref 70–99)
Potassium: 4.6 mmol/L (ref 3.5–5.2)
Sodium: 140 mmol/L (ref 134–144)
eGFR: 93 mL/min/{1.73_m2} (ref >59)

## 2023-12-13 LAB — CBC
Hematocrit: 36.9 % — ABNORMAL LOW (ref 37.5–51.0)
Hemoglobin: 12.1 g/dL — ABNORMAL LOW (ref 13.0–17.7)
MCH: 29.7 pg (ref 26.6–33.0)
MCHC: 32.8 g/dL (ref 31.5–35.7)
MCV: 91 fL (ref 79–97)
Platelets: 243 10*3/uL (ref 150–450)
RBC: 4.07 x10E6/uL — ABNORMAL LOW (ref 4.14–5.80)
RDW: 12 % (ref 11.6–15.4)
WBC: 4.9 10*3/uL (ref 3.4–10.8)

## 2023-12-13 NOTE — Telephone Encounter (Signed)
Jeffrey Liu left a message on the department voicemail that he will be absent from cardiac rehab the rest of this week. He is scheduled for cardioversion on Friday 12/15/23, and he feels uncomfortable to return to exercise prior to that. He plans to return on Monday 12/18/23. He was seen in the office by Dr. Anne Fu yesterday. Will forward message to his nurse case manager and exercise physiologist. Appointments cancelled for the week.

## 2023-12-13 NOTE — Telephone Encounter (Signed)
Spoke with Dr Lahoma Rocker will need clearance to return to exercise post cardioversion.Thayer Headings RN BSN

## 2023-12-14 ENCOUNTER — Encounter: Payer: Self-pay | Admitting: Cardiology

## 2023-12-14 NOTE — Progress Notes (Signed)
Spoke to pt and instructed them to come at [facility] and to be NPO after 0000.  Confirmed no missed doses of AC and instructed to take in AM with a small sip of water.  Confirmed that pt will have a ride home and someone to stay with them for 24 hours after the procedure. Instructed patient to not wear any jewelry or lotion.

## 2023-12-15 ENCOUNTER — Ambulatory Visit (HOSPITAL_COMMUNITY): Payer: Medicare HMO | Admitting: Anesthesiology

## 2023-12-15 ENCOUNTER — Encounter (HOSPITAL_COMMUNITY)
Admission: RE | Disposition: A | Discharge status: Home / Self Care | Payer: Self-pay | Source: Ambulatory Visit | Attending: Cardiovascular Disease

## 2023-12-15 ENCOUNTER — Ambulatory Visit (HOSPITAL_COMMUNITY)
Admission: RE | Admit: 2023-12-15 | Discharge: 2023-12-15 | Disposition: A | Discharge status: Home / Self Care | Payer: Medicare HMO | Source: Ambulatory Visit | Attending: Cardiovascular Disease | Admitting: Cardiovascular Disease

## 2023-12-15 ENCOUNTER — Other Ambulatory Visit: Payer: Self-pay

## 2023-12-15 ENCOUNTER — Encounter (HOSPITAL_COMMUNITY): Payer: Medicare HMO

## 2023-12-15 ENCOUNTER — Encounter (HOSPITAL_COMMUNITY): Payer: Self-pay | Admitting: Cardiovascular Disease

## 2023-12-15 DIAGNOSIS — I4892 Unspecified atrial flutter: Secondary | ICD-10-CM

## 2023-12-15 DIAGNOSIS — Z79899 Other long term (current) drug therapy: Secondary | ICD-10-CM | POA: Insufficient documentation

## 2023-12-15 DIAGNOSIS — Z7901 Long term (current) use of anticoagulants: Secondary | ICD-10-CM | POA: Diagnosis not present

## 2023-12-15 DIAGNOSIS — I4891 Unspecified atrial fibrillation: Secondary | ICD-10-CM | POA: Diagnosis not present

## 2023-12-15 HISTORY — PX: CARDIOVERSION: EP1203

## 2023-12-15 SURGERY — CARDIOVERSION (CATH LAB)
Anesthesia: General

## 2023-12-15 MED ORDER — LIDOCAINE 2% (20 MG/ML) 5 ML SYRINGE
INTRAMUSCULAR | Status: DC | PRN
  Administered 2023-12-15: 60 mg via INTRAVENOUS

## 2023-12-15 MED ORDER — PROPOFOL 10 MG/ML IV BOLUS
INTRAVENOUS | Status: DC | PRN
  Administered 2023-12-15: 50 mg via INTRAVENOUS

## 2023-12-15 SURGICAL SUPPLY — 1 items: PAD DEFIB RADIO PHYSIO CONN (PAD) ×1 IMPLANT

## 2023-12-15 NOTE — Discharge Instructions (Signed)

## 2023-12-15 NOTE — Anesthesia Preprocedure Evaluation (Signed)
Anesthesia Evaluation  Patient identified by MRN, date of birth, ID band Patient awake    Reviewed: Allergy & Precautions, NPO status , Patient's Chart, lab work & pertinent test results, reviewed documented beta blocker date and time   History of Anesthesia Complications Negative for: history of anesthetic complications  Airway Mallampati: I  TM Distance: >3 FB     Dental no notable dental hx.    Pulmonary neg COPD, neg PE   breath sounds clear to auscultation       Cardiovascular (-) hypertension Rhythm:Regular Rate:Normal     Neuro/Psych  Headaches, neg Seizures PSYCHIATRIC DISORDERS Anxiety Depression       GI/Hepatic ,neg GERD  ,,(+) neg Cirrhosis        Endo/Other    Renal/GU Renal disease     Musculoskeletal   Abdominal   Peds  Hematology  (+) Blood dyscrasia, anemia   Anesthesia Other Findings   Reproductive/Obstetrics                             Anesthesia Physical Anesthesia Plan  ASA: 2  Anesthesia Plan: General   Post-op Pain Management:    Induction:   PONV Risk Score and Plan: 2 and Ondansetron  Airway Management Planned: Nasal Cannula and Natural Airway  Additional Equipment:   Intra-op Plan:   Post-operative Plan:   Informed Consent: I have reviewed the patients History and Physical, chart, labs and discussed the procedure including the risks, benefits and alternatives for the proposed anesthesia with the patient or authorized representative who has indicated his/her understanding and acceptance.     Dental advisory given  Plan Discussed with: CRNA  Anesthesia Plan Comments:        Anesthesia Quick Evaluation

## 2023-12-15 NOTE — Transfer of Care (Signed)
Immediate Anesthesia Transfer of Care Note  Patient: Jeffrey Galea, MD  Procedure(s) Performed: CARDIOVERSION  Patient Location: PACU and Cath Lab  Anesthesia Type:General  Level of Consciousness: drowsy, patient cooperative, and responds to stimulation  Airway & Oxygen Therapy: Patient Spontanous Breathing and Patient connected to nasal cannula oxygen  Post-op Assessment: Report given to RN and Post -op Vital signs reviewed and stable  Post vital signs: Reviewed and stable  Last Vitals:  Vitals Value Taken Time  BP    Temp    Pulse 61 12/15/23 0849  Resp 15 12/15/23 0849  SpO2 98 % 12/15/23 0849  Vitals shown include unfiled device data.  Last Pain:  Vitals:   12/15/23 0755  TempSrc:   PainSc: 0-No pain         Complications: No notable events documented.

## 2023-12-15 NOTE — Anesthesia Postprocedure Evaluation (Signed)
Anesthesia Post Note  Patient: Elpidio Galea, MD  Procedure(s) Performed: CARDIOVERSION     Patient location during evaluation: PACU Anesthesia Type: General Level of consciousness: awake and alert Pain management: pain level controlled Vital Signs Assessment: post-procedure vital signs reviewed and stable Respiratory status: spontaneous breathing, nonlabored ventilation, respiratory function stable and patient connected to nasal cannula oxygen Cardiovascular status: blood pressure returned to baseline and stable Postop Assessment: no apparent nausea or vomiting Anesthetic complications: no   No notable events documented.  Last Vitals:  Vitals:   12/15/23 0858 12/15/23 0900  BP: 118/78 118/78  Pulse: 67 65  Resp: 14 15  Temp:    SpO2: 99% 100%    Last Pain:  Vitals:   12/15/23 0858  TempSrc:   PainSc: 0-No pain                 Mariann Barter

## 2023-12-15 NOTE — Interval H&P Note (Signed)
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  The various methods of treatment have been discussed with the patient and family. After consideration of risks, benefits and other options for treatment, the patient has consented to  Procedure(s): CARDIOVERSION (N/A) as a surgical intervention.  The patient's history has been reviewed, patient examined, no change in status, stable for surgery.  I have reviewed the patient's chart and labs.  Questions were answered to the patient's satisfaction.    NPO for DCCV. On eliquis >3 weeks. No missed doses.   Gerri Spore T. Flora Lipps, MD, [facility]  [facility]  [address], [address] [place], [place] [address] [phone]  7:49 AM

## 2023-12-15 NOTE — CV Procedure (Signed)
   DIRECT CURRENT CARDIOVERSION  NAME:  Jeffrey Chewning, MD    MRN: [id number] DOB:  1956-03-17    ADMIT DATE: 12/15/2023  Indication:  Symptomatic atrial flutter  Procedure Note:  The patient signed informed consent.  They have had had therapeutic anticoagulation with eliquis greater than 3 weeks.  Anesthesia was administered by Dr. Ace Gins.  Adequate airway was maintained throughout and vital followed per protocol.  They were cardioverted x 1 with 200J of biphasic synchronized energy.  They converted to NSR.  There were no apparent complications.  The patient had normal neuro status and respiratory status post procedure with vitals stable as recorded elsewhere.    Follow up: They will continue on current medical therapy and follow up with cardiology as scheduled.  Gerri Spore T. Flora Lipps, MD, [facility]  [facility]  [address], [address] [place], [place] [address] [phone]  8:53 AM

## 2023-12-18 ENCOUNTER — Encounter (HOSPITAL_COMMUNITY)
Admission: RE | Admit: 2023-12-18 | Discharge: 2023-12-18 | Disposition: A | Discharge status: Home / Self Care | Payer: Medicare HMO | Source: Ambulatory Visit | Attending: Cardiology | Admitting: Cardiology

## 2023-12-18 ENCOUNTER — Encounter (HOSPITAL_COMMUNITY): Payer: Medicare HMO

## 2023-12-18 DIAGNOSIS — Z48812 Encounter for surgical aftercare following surgery on the circulatory system: Secondary | ICD-10-CM | POA: Diagnosis not present

## 2023-12-18 DIAGNOSIS — Z9889 Other specified postprocedural states: Secondary | ICD-10-CM

## 2023-12-18 NOTE — Progress Notes (Signed)
 Magnus returned to exercise per Dr Anne Fu and exercised without difficulty. Thayer Headings RN BSN

## 2023-12-19 ENCOUNTER — Telehealth (HOSPITAL_COMMUNITY): Payer: Self-pay

## 2023-12-20 ENCOUNTER — Encounter (HOSPITAL_COMMUNITY)
Admission: RE | Admit: 2023-12-20 | Discharge: 2023-12-20 | Disposition: A | Discharge status: Home / Self Care | Payer: Medicare HMO | Source: Ambulatory Visit | Attending: Cardiology | Admitting: Cardiology

## 2023-12-20 ENCOUNTER — Encounter (HOSPITAL_COMMUNITY): Payer: Medicare HMO

## 2023-12-20 DIAGNOSIS — Z9889 Other specified postprocedural states: Secondary | ICD-10-CM

## 2023-12-20 DIAGNOSIS — Z48812 Encounter for surgical aftercare following surgery on the circulatory system: Secondary | ICD-10-CM | POA: Diagnosis not present

## 2023-12-22 ENCOUNTER — Encounter (HOSPITAL_COMMUNITY): Payer: Medicare HMO

## 2023-12-22 ENCOUNTER — Encounter (HOSPITAL_COMMUNITY)
Admission: RE | Admit: 2023-12-22 | Discharge: 2023-12-22 | Disposition: A | Discharge status: Home / Self Care | Payer: Medicare HMO | Source: Ambulatory Visit | Attending: Cardiology | Admitting: Cardiology

## 2023-12-22 DIAGNOSIS — Z9889 Other specified postprocedural states: Secondary | ICD-10-CM

## 2023-12-22 DIAGNOSIS — Z48812 Encounter for surgical aftercare following surgery on the circulatory system: Secondary | ICD-10-CM | POA: Diagnosis not present

## 2023-12-22 NOTE — Progress Notes (Signed)
Reviewed home exercise Rx with patient today.  Encouraged warm-up, cool-down, and stretching. Reviewed THRR of  60 - 120 and keeping RPE between 11-13. Encouraged to hydrate with activity.  Reviewed weather parameters for temperature and humidity for safe exercise outdoors. Reviewed S/S to terminate exercise and when to call 911 vs MD.  Pt encouraged to always carry a cell phone for safety when exercising outdoors. Pt verbalized understanding of the home exercise Rx and was provided a copy.   Kristie Bracewell MS, ACSM-CEP, CCRP  

## 2023-12-25 ENCOUNTER — Encounter (HOSPITAL_COMMUNITY): Payer: Medicare HMO

## 2023-12-25 ENCOUNTER — Encounter (HOSPITAL_COMMUNITY)
Admission: RE | Admit: 2023-12-25 | Discharge: 2023-12-25 | Disposition: A | Discharge status: Home / Self Care | Payer: Medicare HMO | Source: Ambulatory Visit | Attending: Cardiology | Admitting: Cardiology

## 2023-12-25 DIAGNOSIS — Z48812 Encounter for surgical aftercare following surgery on the circulatory system: Secondary | ICD-10-CM | POA: Diagnosis not present

## 2023-12-25 DIAGNOSIS — Z9889 Other specified postprocedural states: Secondary | ICD-10-CM

## 2023-12-25 NOTE — Progress Notes (Unsigned)
 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   Discussed the use of AI scribe software for clinical note transcription with the patient, who gave verbal consent to proceed.  History of Present Illness   He reports experiencing episodes of both atrial fibrillation and atrial flutter while admitted to the hospital immediately post cardiac surgery. He states he initially had atrial fib then converted to flutter after receiving amiodarone. He then underwent cardioversion but had another episode of atrial fibrillation a few days later which spontaneously terminated with amiodarone on board. He had another episode since discharge, and was most recently cardioverted from AFL to sinus on 12/15/23. He thinks he may have had an episode of AF/AFL in September of 2024, which he estimates lasted 10 minutes or so. The patient describes these episodes as symptomatic, with irregular heart rhythm and increased shortness of breath during exertion. The patient has been off amiodarone and beta-blocker due to bradycardia, which occurred about four weeks post-surgery. He has been maintaining a normal rhythm since his cardioversion two weeks ago. The patient is physically active, exercising regularly. He is currently on Eliquis and has Metoprolol available as needed.     Review of systems complete and found to be negative unless listed in HPI.   EP Information / Studies Reviewed:    EKG is not ordered today. EKG from 12/12/23 reviewed which showed rate controlled AFL .      10/12/23 Echo:  CONCLUSIONS:  - Technically  difficult exam due to post op, bandages/chest tubes/wound and  suboptimal positioning.  - Exam indication: s/p Onalee Hua Procedure; (AVr, Ao Root & Ascending Replacement)  - The left ventricle is normal in size. Left ventricular systolic function is  normal. EF = 57  5% (2D biplane)  - The right ventricle is dilated. Right ventricular systolic function is mildly  decreased.  - Tricuspid aortic valve. s/p Onalee Hua Procedure prosthetic aortic valve. There is  trace aortic valve regurgitation. The peak gradient is 17 mmHg, the mean gradient  is 8 mmHg and the dimensionless valve index is 0.54.  Visualized aorta appears intact.  - Exam was compared with the prior CC echocardiographic exam performed on  10/09/2023 (OR). S/P Onalee Hua Procedure.   Risk Assessment/Calculations:    CHA2DS2-VASc Score = 2   This indicates a 2.2% annual risk of stroke. The patient's score is based upon: CHF History: 0 HTN History: 0 Diabetes History: 0 Stroke History: 0 Vascular Disease History: 1 Age Score: 1 Gender Score: 0             Physical Exam:   VS:  BP 124/80 (BP Location: Left Arm, Patient Position: Sitting, Cuff Size: Normal)   Pulse 74   Ht 6\' 4"  (1.93 m)   Wt 183 lb (83 kg)   SpO2 98%   BMI 22.28 kg/m    Wt Readings from Last 3 Encounters:  12/26/23 183 lb (83 kg)  12/12/23 191 lb (86.6 kg)  11/22/23 191 lb 12.8 oz (87 kg)     GEN: Well nourished, well developed in no acute distress NECK: No JVD CARDIAC: Normal  rate, regular rhythm. 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   #. Post operative atrial flutter: Appears typical by ECG but given recent  cardiac surgery, cannot exclude atypical.  #. Post operative atrial fibrillation: #. 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 - He is still within the 90 day post operative window. Unclear whether AF/AFL all secondary to inflammation and hemodynamic changes associated with surgery or related to his underlying substrate. I suspect he will have future episodes. He has maintained sinus rhythm since last cardioversion almost 2 weeks ago. I think it is reasonable to continue as is and monitor for recurrence. He has symptoms and has the ability to reliably monitor his HR and rhythm. If he has recurrence we discussed anti-arrhythmic drug therapy vs catheter ablation and he is leaning towards catheter ablation. He has metoprolol available to take as needed in the interim, if arrhythmia recurs. - Continue Eliquis.   #. S/p aortic root replacement and aortic valve resuspension:  -Appears well compensated. Continue close follow up with primary cardiologist, Dr. Anne Fu. We will repeat echocardiogram in 1 month.  Follow up with Dr. Jimmey Ralph in 3 months.  Signed, Nobie Putnam, MD

## 2023-12-26 ENCOUNTER — Encounter: Payer: Self-pay | Admitting: Cardiology

## 2023-12-26 ENCOUNTER — Ambulatory Visit: Payer: Medicare HMO | Attending: Cardiology | Admitting: Cardiology

## 2023-12-26 ENCOUNTER — Telehealth: Payer: Self-pay | Admitting: Cardiology

## 2023-12-26 VITALS — BP 124/80 | HR 74 | Ht 76.0 in | Wt 183.0 lb

## 2023-12-26 DIAGNOSIS — I4892 Unspecified atrial flutter: Secondary | ICD-10-CM

## 2023-12-26 DIAGNOSIS — D6869 Other thrombophilia: Secondary | ICD-10-CM

## 2023-12-26 DIAGNOSIS — I480 Paroxysmal atrial fibrillation: Secondary | ICD-10-CM | POA: Diagnosis not present

## 2023-12-26 DIAGNOSIS — Z9889 Other specified postprocedural states: Secondary | ICD-10-CM

## 2023-12-26 DIAGNOSIS — Z8679 Personal history of other diseases of the circulatory system: Secondary | ICD-10-CM | POA: Diagnosis not present

## 2023-12-26 DIAGNOSIS — I7121 Aneurysm of the ascending aorta, without rupture: Secondary | ICD-10-CM

## 2023-12-26 NOTE — Patient Instructions (Signed)
 Medication Instructions:  Your physician recommends that you continue on your current medications as directed. Please refer to the Current Medication list given to you today.  *If you need a refill on your cardiac medications before your next appointment, please call your pharmacy*  Tests/Procedures Echocardiogram  Your physician has requested that you have an echocardiogram. Echocardiography is a painless test that uses sound waves to create images of your heart. It provides your doctor with information about the size and shape of your heart and how well your heart's chambers and valves are working. This procedure takes approximately one hour. There are no restrictions for this procedure. Please do NOT wear cologne, perfume, aftershave, or lotions (deodorant is allowed). Please arrive 15 minutes prior to your appointment time.  Please note: We ask at that you not bring children with you during ultrasound (echo/ vascular) testing. Due to room size and safety concerns, children are not allowed in the ultrasound rooms during exams. Our front office staff cannot provide observation of children in our lobby area while testing is being conducted. An adult accompanying a patient to their appointment will only be allowed in the ultrasound room at the discretion of the ultrasound technician under special circumstances. We apologize for any inconvenience.  Follow-Up: At [facility], you and your health needs are our priority.  As part of our continuing mission to provide you with exceptional heart care, we have created designated Provider Care Teams.  These Care Teams include your primary Cardiologist (physician) and Advanced Practice Providers (APPs -  Physician Assistants and Nurse Practitioners) who all work together to provide you with the care you need, when you need it.  Your next appointment:   3 months  Provider:   Nobie Putnam, MD

## 2023-12-26 NOTE — Telephone Encounter (Signed)
 Called pt  and left a message to get an echo scheduled in one  month and a FU with oarker in 3 month.

## 2023-12-27 ENCOUNTER — Encounter (HOSPITAL_COMMUNITY): Payer: Medicare HMO

## 2023-12-27 ENCOUNTER — Encounter (HOSPITAL_COMMUNITY)
Admission: RE | Admit: 2023-12-27 | Discharge: 2023-12-27 | Disposition: A | Discharge status: Home / Self Care | Payer: Medicare HMO | Source: Ambulatory Visit | Attending: Cardiology

## 2023-12-27 DIAGNOSIS — I4892 Unspecified atrial flutter: Secondary | ICD-10-CM

## 2023-12-27 DIAGNOSIS — Z9889 Other specified postprocedural states: Secondary | ICD-10-CM

## 2023-12-27 DIAGNOSIS — Z48812 Encounter for surgical aftercare following surgery on the circulatory system: Secondary | ICD-10-CM | POA: Diagnosis not present

## 2023-12-28 ENCOUNTER — Telehealth (HOSPITAL_COMMUNITY): Payer: Self-pay | Admitting: *Deleted

## 2023-12-28 ENCOUNTER — Telehealth (HOSPITAL_COMMUNITY): Payer: Self-pay

## 2023-12-28 NOTE — Telephone Encounter (Signed)
-----   Message from Donato Schultz sent at 12/28/2023 12:47 PM EST ----- Regarding: RE: THRR increase for Elpidio Galea, MD Agree Donato Schultz, MD ----- Message ----- From: Lorin Picket Sent: 12/27/2023   2:13 PM EST To: Jake Bathe, MD; Cammy Copa, RN Subject: Sela Hua increase for Elpidio Galea, MD            Good afternoon Dr. 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 Peak METs are 9.0. Blood pressures at rest have ranged from 96-112/52-70, and with exercise 116-138/60-78. He had a THR of 120 (80%) and exceeds this at times and would like to work harder. Pt has been in NSR since his cardioversion on 12/15/23.  I am requesting an increase in THR to 136 if you are agreeable. Please let me know if you require any additional information.   Best Regards,  Lorin Picket MS, ACSM-CEP, CCRP

## 2023-12-28 NOTE — Telephone Encounter (Signed)
 Message left on departmental voicemail for cardiac rehab.  Pt will be absent on Friday 2/28 due to work commitments.  To return on Monday. Alanson Aly, BSN Cardiac and Emergency planning/management officer

## 2023-12-29 ENCOUNTER — Encounter (HOSPITAL_COMMUNITY): Payer: Medicare HMO

## 2024-01-01 ENCOUNTER — Encounter (HOSPITAL_COMMUNITY): Payer: Medicare HMO

## 2024-01-01 ENCOUNTER — Encounter (HOSPITAL_COMMUNITY)
Admission: RE | Admit: 2024-01-01 | Discharge: 2024-01-01 | Disposition: A | Discharge status: Home / Self Care | Payer: Medicare HMO | Source: Ambulatory Visit | Attending: Cardiology | Admitting: Cardiology

## 2024-01-01 DIAGNOSIS — I7121 Aneurysm of the ascending aorta, without rupture: Secondary | ICD-10-CM | POA: Diagnosis not present

## 2024-01-01 DIAGNOSIS — Z9889 Other specified postprocedural states: Secondary | ICD-10-CM | POA: Insufficient documentation

## 2024-01-01 DIAGNOSIS — I4892 Unspecified atrial flutter: Secondary | ICD-10-CM | POA: Insufficient documentation

## 2024-01-01 DIAGNOSIS — I351 Nonrheumatic aortic (valve) insufficiency: Secondary | ICD-10-CM | POA: Insufficient documentation

## 2024-01-02 NOTE — Progress Notes (Signed)
 Cardiac Individual Treatment Plan  Patient Details  Name: Jeffrey Kober, MD MRN: [id number] Date of Birth: 1956-05-22 Referring Provider:   Flowsheet Row INTENSIVE CARDIAC REHAB ORIENT from 11/22/2023 in [facility]  Referring Provider Donato Schultz, MD       Initial Encounter Date:  Flowsheet Row INTENSIVE CARDIAC REHAB ORIENT from 11/22/2023 in [facility]  Date 11/22/23       Visit Diagnosis: 10/08/24 S/P aortic valve repair  Atrial flutter, unspecified type (HCC)  Patient's Home Medications on Admission:  Current Outpatient Medications:    acetaminophen (TYLENOL) 500 MG tablet, Take 1,000 mg by mouth every 6 (six) hours as needed (pain.)., Disp: , Rfl:    apixaban (ELIQUIS) 5 MG TABS tablet, Take 1 tablet (5 mg total) by mouth 2 (two) times daily., Disp: 60 tablet, Rfl: 4   aspirin 81 MG chewable tablet, Chew 81 mg by mouth in the morning., Disp: , Rfl:    FLUoxetine (PROZAC) 20 MG tablet, Take 20 mg by mouth in the morning., Disp: , Rfl:    LORazepam (ATIVAN) 1 MG tablet, Take 0.25 mg by mouth at bedtime as needed (sleep)., Disp: , Rfl:    Multiple Vitamin (MULTIVITAMIN WITH MINERALS) TABS tablet, Take 1 tablet by mouth daily., Disp: , Rfl:    Omeprazole 20 MG TBEC, Take 20 mg by mouth daily before breakfast., Disp: , Rfl:    rosuvastatin (CRESTOR) 10 MG tablet, Take 10 mg by mouth 2 (two) times a week. Wednesdays & Sundays, Disp: , Rfl:   Past Medical History: Past Medical History:  Diagnosis Date   Anxiety    Cancer (HCC)    skin cancers/melanoma and basal cell.   Depression    Dyspepsia    Dysthymia    Family history of prostate cancer    Gilbert's syndrome    Glaucoma    Heartburn    History of adenomatous polyp of colon    History of melanoma    Hyperlipidemia    Liver hemangioma    Low HDL (under 40)    Microcytic anemia    Migraine headache     Nephrolithiasis    Palpitations    Primary open angle glaucoma of both eyes, unspecified glaucoma stage    Pure hypercholesterolemia    Tinnitus of both ears     Tobacco Use: Social History   Tobacco Use  Smoking Status Never  Smokeless Tobacco Never    Labs: Review Flowsheet        No data to display          Capillary Blood Glucose: No results found for: "GLUCAP"   Exercise Target Goals: Exercise Program Goal: Individual exercise prescription set using results from initial 6 min walk test and THRR while considering  patient's activity barriers and safety.   Exercise Prescription Goal: Initial exercise prescription builds to 30-45 minutes a day of aerobic activity, 2-3 days per week.  Home exercise guidelines will be given to patient during program as part of exercise prescription that the participant will acknowledge.  Activity Barriers & Risk Stratification:  Activity Barriers & Cardiac Risk Stratification - 11/22/23 0823       Activity Barriers & Cardiac Risk Stratification   Activity Barriers Other (comment)    Comments sternal precautions    Cardiac Risk Stratification High   <5 METs on  6 Minute Walk:  6 Minute Walk     Row Name 11/22/23 1003         6 Minute Walk   Phase Initial     Distance 1820 feet     Walk Time 6 minutes     # of Rest Breaks 0     MPH 3.45     METS 4.36     RPE 10     Perceived Dyspnea  1.5     VO2 Peak 15.26     Symptoms Yes (comment)     Comments 2/10 incision/sternal pain, SOB. Resolved with rest     Resting HR 60 bpm     Resting BP 112/58     Resting Oxygen Saturation  98 %     Exercise Oxygen Saturation  during 6 min walk 96 %     Max Ex. HR 95 bpm     Max Ex. BP 146/60     2 Minute Post BP 128/62              Oxygen Initial Assessment:   Oxygen Re-Evaluation:   Oxygen Discharge (Final Oxygen Re-Evaluation):   Initial Exercise Prescription:  Initial Exercise Prescription -  11/22/23 1000       Date of Initial Exercise RX and Referring Provider   Date 11/22/23    Referring Provider Donato Schultz, MD    Expected Discharge Date 02/14/24      Treadmill   MPH 3.2    Grade 0    Minutes 15    METs 3.5      Recumbant Elliptical   Level 2    RPM 60    Watts 90    Minutes 15    METs 3.5      Prescription Details   Frequency (times per week) 3    Duration Progress to 30 minutes of continuous aerobic without signs/symptoms of physical distress      Intensity   THRR 40-80% of Max Heartrate 60-120    Ratings of Perceived Exertion 11-13    Perceived Dyspnea 0-4      Progression   Progression Continue progressive overload as per policy without signs/symptoms or physical distress.      Resistance Training   Training Prescription Yes    Weight 4    Reps 10-15             Perform Capillary Blood Glucose checks as needed.  Exercise Prescription Changes:   Exercise Prescription Changes     Row Name 11/27/23 1400 12/22/23 1600           Response to Exercise   Blood Pressure (Admit) 110/58 110/70      Blood Pressure (Exercise) 116/64 126/60      Blood Pressure (Exit) 104/52 104/56      Heart Rate (Admit) 64 bpm 68 bpm      Heart Rate (Exercise) 74 bpm 131 bpm      Heart Rate (Exit) 58 bpm 80 bpm      Rating of Perceived Exertion (Exercise) 10 11      Symptoms None None      Comments Pt's first day in the CRP[facility] program Reviewed METs, goals and Home exercise Rx      Duration Continue with 30 min of aerobic exercise without signs/symptoms of physical distress. Continue with 30 min of aerobic exercise without signs/symptoms of physical distress.      Intensity THRR unchanged THRR unchanged  Progression   Progression Continue to progress workloads to maintain intensity without signs/symptoms of physical distress. Continue to progress workloads to maintain intensity without signs/symptoms of physical distress.      Average METs 4.9 7.6         Resistance Training   Training Prescription Yes Yes      Weight 4 4 lbs      Reps 10-15 10-15      Time 10 Minutes 10 Minutes        Interval Training   Interval Training No No        NuStep   Level 2 4      SPM --  No mets of SPM 164      Minutes 15 15      METs -- 9        Recumbant Elliptical   Level 2 4      RPM 71 86      Watts 105 140      Minutes 15 15      METs 4.9 6.2        Home Exercise Plan   Plans to continue exercise at -- Home (comment)      Frequency -- Add 3 additional days to program exercise sessions.      Initial Home Exercises Provided -- 12/22/23               Exercise Comments:   Exercise Comments     Row Name 11/27/23 1417 12/22/23 1643         Exercise Comments Pt's first day in the CRP2 program. Pt exercised today with no complaints. Off to a good start. Reviewed METs, goals and HERx. Pt is progressing well. Pt verbalized understanding of the home exercise Rx and was provied a copy.               Exercise Goals and Review:   Exercise Goals     Row Name 11/22/23 0745             Exercise Goals   Increase Physical Activity Yes       Intervention Provide advice, education, support and counseling about physical activity/exercise needs.;Develop an individualized exercise prescription for aerobic and resistive training based on initial evaluation findings, risk stratification, comorbidities and participant's personal goals.       Expected Outcomes Short Term: Attend rehab on a regular basis to increase amount of physical activity.;Long Term: Exercising regularly at least 3-5 days a week.;Long Term: Add in home exercise to make exercise part of routine and to increase amount of physical activity.       Increase Strength and Stamina Yes       Intervention Provide advice, education, support and counseling about physical activity/exercise needs.;Develop an individualized exercise prescription for aerobic and resistive training based  on initial evaluation findings, risk stratification, comorbidities and participant's personal goals.       Expected Outcomes Short Term: Increase workloads from initial exercise prescription for resistance, speed, and METs.;Short Term: Perform resistance training exercises routinely during rehab and add in resistance training at home;Long Term: Improve cardiorespiratory fitness, muscular endurance and strength as measured by increased METs and functional capacity ( )       Able to understand and use rate of perceived exertion (RPE) scale Yes       Intervention Provide education and explanation on how to use RPE scale       Expected Outcomes Short Term: Able to use RPE daily in  rehab to express subjective intensity level;Long Term:  Able to use RPE to guide intensity level when exercising independently       Knowledge and understanding of Target Heart Rate Range (THRR) Yes       Intervention Provide education and explanation of THRR including how the numbers were predicted and where they are located for reference       Expected Outcomes Short Term: Able to state/look up THRR;Short Term: Able to use daily as guideline for intensity in rehab;Long Term: Able to use THRR to govern intensity when exercising independently       Understanding of Exercise Prescription Yes       Intervention Provide education, explanation, and written materials on patient's individual exercise prescription       Expected Outcomes Short Term: Able to explain program exercise prescription;Long Term: Able to explain home exercise prescription to exercise independently                Exercise Goals Re-Evaluation :  Exercise Goals Re-Evaluation     Row Name 11/27/23 1416 12/22/23 1640           Exercise Goal Re-Evaluation   Exercise Goals Review Increase Physical Activity;Understanding of Exercise Prescription;Increase Strength and Stamina;Knowledge and understanding of Target Heart Rate Range (THRR);Able to  understand and use rate of perceived exertion (RPE) scale Increase Physical Activity;Understanding of Exercise Prescription;Increase Strength and Stamina;Knowledge and understanding of Target Heart Rate Range (THRR);Able to understand and use rate of perceived exertion (RPE) scale      Comments Pt's first day in the [facility]2 program. Pt understands the exercise Rx, RPE scale and THRR. Reviewed METs, goals and Home exercise Rx today. Pt is making progress on his goals of imrpoved strength and stamina. Pt is riding his bike at home and feels that he is almost back to pre surgery baseline. Peak MET sre 9.0. Pt has been riding the bike and walking at home 2-3x/week and will continue this.      Expected Outcomes Will continue to montior patient and progress exercise workloads as tolerated. Will continue to montior patient and progress exercise workloads as tolerated.               Discharge Exercise Prescription (Final Exercise Prescription Changes):  Exercise Prescription Changes - 12/22/23 1600       Response to Exercise   Blood Pressure (Admit) 110/70    Blood Pressure (Exercise) 126/60    Blood Pressure (Exit) 104/56    Heart Rate (Admit) 68 bpm    Heart Rate (Exercise) 131 bpm    Heart Rate (Exit) 80 bpm    Rating of Perceived Exertion (Exercise) 11    Symptoms None    Comments Reviewed METs, goals and Home exercise Rx    Duration Continue with 30 min of aerobic exercise without signs/symptoms of physical distress.    Intensity THRR unchanged      Progression   Progression Continue to progress workloads to maintain intensity without signs/symptoms of physical distress.    Average METs 7.6      Resistance Training   Training Prescription Yes    Weight 4 lbs    Reps 10-15    Time 10 Minutes      Interval Training   Interval Training No      NuStep   Level 4    SPM 164    Minutes 15    METs 9      Recumbant Elliptical   Level 4  RPM 86    Watts 140    Minutes 15    METs  6.2      Home Exercise Plan   Plans to continue exercise at Home (comment)    Frequency Add 3 additional days to program exercise sessions.    Initial Home Exercises Provided 12/22/23             Nutrition:  Target Goals: Understanding of nutrition guidelines, daily intake of sodium 1500mg , cholesterol 200mg , calories 30% from fat and 7% or less from saturated fats, daily to have 5 or more servings of fruits and vegetables.  Biometrics:  Pre Biometrics - 11/22/23 0820       Pre Biometrics   Waist Circumference 37 inches    Hip Circumference 38 inches    Waist to Hip Ratio 0.97 %    Triceps Skinfold 11 mm    % Body Fat 22.7 %    Grip Strength 54 kg    Flexibility 11.25 in   knees bent. with knees straight/flat could not reach   Single Leg Stand 30 seconds              Nutrition Therapy Plan and Nutrition Goals:  Nutrition Therapy & Goals - 12/26/23 1047       Nutrition Therapy   Diet Heart Healthy Diet    Drug/Food Interactions Statins/Certain Fruits      Personal Nutrition Goals   Nutrition Goal Patient to identify strategies for reducing cardiovascular risk by attending the [employer] education and nutrition series weekly.   goal in progress.   Personal Goal #2 Patient to improve diet quality by using the plate method as a guide for meal planning to include lean protein/plant protein, fruits, vegetables, whole grains, nonfat dairy as part of a well-balanced diet.   goal in progress.   Comments Goals in progress. Buddie continues to attend the [facility] education and nutrition series reguarly. 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 He is down 4.4# since starting with our program. Patient will benefit from participation in intensive cardiac rehab for nutrition, exercise, and lifestyle modification.      Intervention Plan   Intervention Prescribe, educate and counsel regarding individualized specific  dietary modifications aiming towards targeted core components such as weight, hypertension, lipid management, diabetes, heart failure and other comorbidities.;Nutrition handout(s) given to patient.    Expected Outcomes Short Term Goal: Understand basic principles of dietary content, such as calories, fat, sodium, cholesterol and nutrients.;Long Term Goal: Adherence to prescribed nutrition plan.             Nutrition Assessments:  MEDIFICTS Score Key: >=70 Need to make dietary changes  40-70 Heart Healthy Diet <= 40 Therapeutic Level Cholesterol Diet   Flowsheet Row INTENSIVE CARDIAC REHAB ORIENT from 11/22/2023 in [facility]  Picture Your Plate Total Score on Admission 82      Picture Your Plate Scores: <62 Unhealthy dietary pattern with much room for improvement. 41-50 Dietary pattern unlikely to meet recommendations for good health and room for improvement. 51-60 More healthful dietary pattern, with some room for improvement.  >60 Healthy dietary pattern, although there may be some specific behaviors that could be improved.    Nutrition Goals Re-Evaluation:  Nutrition Goals Re-Evaluation     Row Name 12/26/23 1047             Goals   Current Weight 191 lb 12.8  oz (87 kg)       Comment unable to view lipid panel. CMP WNL.       Expected Outcome Goals in progress. Rollin continues to attend the [employer] education and nutrition series reguarly. 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 He is down 4.4# since starting with our program. Patient will benefit from participation in intensive cardiac rehab for nutrition, exercise, and lifestyle modification.                Nutrition Goals Re-Evaluation:  Nutrition Goals Re-Evaluation     Row Name 12/26/23 1047             Goals   Current Weight 191 lb 12.8 oz (87 kg)       Comment unable to  view lipid panel. CMP WNL.       Expected Outcome Goals in progress. Crosby continues to attend the [facility] education and nutrition series reguarly. 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 He is down 4.4# since starting with our program. Patient will benefit from participation in intensive cardiac rehab for nutrition, exercise, and lifestyle modification.                Nutrition Goals Discharge (Final Nutrition Goals Re-Evaluation):  Nutrition Goals Re-Evaluation - 12/26/23 1047       Goals   Current Weight 191 lb 12.8 oz (87 kg)    Comment unable to view lipid panel. CMP WNL.    Expected Outcome Goals in progress. Lindley continues to attend the [employer] education and nutrition series reguarly. 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 He is down 4.4# since starting with our program. Patient will benefit from participation in intensive cardiac rehab for nutrition, exercise, and lifestyle modification.             Psychosocial: Target Goals: Acknowledge presence or absence of significant depression and/or stress, maximize coping skills, provide positive support system. Participant is able to verbalize types and ability to use techniques and skills needed for reducing stress and depression.  Initial Review & Psychosocial Screening:  Initial Psych Review & Screening - 11/22/23 0832       Initial Review   Current issues with None Identified      Family Dynamics   Good Support System? Yes   Pedro has his wife for support     Barriers   Psychosocial barriers to participate in program There are no identifiable barriers or psychosocial needs.      Screening Interventions   Interventions Encouraged to exercise;Provide feedback about the scores to participant    Expected Outcomes Short Term goal: Identification and review with participant of any Quality  of Life or Depression concerns found by scoring the questionnaire.;Long Term goal: The participant improves quality of Life and PHQ9 Scores as seen by post scores and/or verbalization of changes             Quality of Life Scores:  Quality of Life - 11/22/23 0838       Quality of Life   Select Quality of Life      Quality of Life Scores   Health/Function Pre 28.7 %    Socioeconomic Pre 30 %    Psych/Spiritual Pre 27.14 %    Family Pre 30 %    GLOBAL Pre 28.84 %  Scores of 19 and below usually indicate a poorer quality of life in these areas.  A difference of  2-3 points is a clinically meaningful difference.  A difference of 2-3 points in the total score of the Quality of Life Index has been associated with significant improvement in overall quality of life, self-image, physical symptoms, and general health in studies assessing change in quality of life.  PHQ-9: Review Flowsheet       11/22/2023  Depression screen PHQ 2/9  Decreased Interest 0  Down, Depressed, Hopeless 0  PHQ - 2 Score 0  Altered sleeping 2  Tired, decreased energy 0  Change in appetite 0  Feeling bad or failure about yourself  0  Trouble concentrating 0  Moving slowly or fidgety/restless 0  Suicidal thoughts 0  PHQ-9 Score 2  Difficult doing work/chores Not difficult at all   Interpretation of Total Score  Total Score Depression Severity:  1-4 = Minimal depression, 5-9 = Mild depression, 10-14 = Moderate depression, 15-19 = Moderately severe depression, 20-27 = Severe depression   Psychosocial Evaluation and Intervention:   Psychosocial Re-Evaluation:  Psychosocial Re-Evaluation     Row Name 11/27/23 1603 01/02/24 1027           Psychosocial Re-Evaluation   Current issues with Current Sleep Concerns None Identified      Comments Jaedin says he has difficulty going back to sleep when he wakes up. Jory works on a crossword puzzle to help with this. Rockey has not voiced any  increased concerns or stressors during exercise at cardiac rehab.      Expected Outcomes Andros will have improved sleep habits upon completion of cardiac rehab --      Interventions Relaxation education;Stress management education;Encouraged to attend Cardiac Rehabilitation for the exercise Relaxation education;Stress management education;Encouraged to attend Cardiac Rehabilitation for the exercise      Continue Psychosocial Services  No Follow up required No Follow up required               Psychosocial Discharge (Final Psychosocial Re-Evaluation):  Psychosocial Re-Evaluation - 01/02/24 0918       Psychosocial Re-Evaluation   Current issues with None Identified    Comments Leonidus has not voiced any increased concerns or stressors during exercise at cardiac rehab.    Interventions Relaxation education;Stress management education;Encouraged to attend Cardiac Rehabilitation for the exercise    Continue Psychosocial Services  No Follow up required             Vocational Rehabilitation: Provide vocational rehab assistance to qualifying candidates.   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 Participant will state understanding/return demonstration of topics presented.    Education     Row Name 11/27/23 0800     Education   Cardiac Education Topics [facility]   Select Workshops     Workshops   Educator Nurse   Select Exercise   Exercise Workshop Managing Heart Disease: Your Path to a Healthier Heart   Instruction Review Code 1- Verbalizes Understanding   Class Start Time [phone]   Class Stop Time 0846   Class Time Calculation (min) 35 min  Row Name 11/29/23 1000     Education   Cardiac Education Topics [facility]   Secondary school teacher School   Educator Dietitian;Respiratory Therapist   Weekly Topic Simple Sides and Sauces   Instruction Review Code 1- Verbalizes Understanding   Class Start Time 0815   Class Stop Time 0847   Class Time Calculation (min) 32 min    Row Name 12/01/23 0800     Education   Cardiac Education Topics [facility]   Select Core Videos     Core Videos   Educator Exercise Physiologist   Select General Education   General Education Hypertension and Heart Disease   Instruction Review Code 1- Verbalizes Understanding   Class Start Time 0815   Class Stop Time 0849   Class Time Calculation (min) 34 min    Row Name 12/04/23 0800     Education   Cardiac Education Topics [facility]   Geographical information systems officer Psychosocial   Psychosocial Workshop From Head to Heart: The Power of a Healthy Outlook   Instruction Review Code 1- Verbalizes Understanding   Class Start Time 0815   Class Stop Time 0903   Class Time Calculation (min) 48 min    Row Name 12/06/23 0900     Education   Cardiac Education Topics [facility]   Secondary school teacher School   Educator Respiratory Therapist;Nurse   Weekly Topic Powerhouse Plant-Based Proteins   Instruction Review Code 1- Verbalizes Understanding   Class Start Time 0815   Class Stop Time 0847   Class Time Calculation (min) 32 min    Row Name 12/11/23 0900     Education   Cardiac Education Topics [facility]   Select Workshops     Workshops   Educator Exercise Physiologist   Select Exercise   Exercise Workshop Location manager and Fall Prevention   Instruction Review Code 1- Verbalizes Understanding   Class Start Time [phone]   Class Stop Time 0848   Class Time Calculation (min) 34 min    Row Name 12/18/23 0900     Education   Cardiac Education Topics [facility]   Nurse, mental health   Educator Exercise Physiologist   Select Psychosocial   Psychosocial Healthy Minds, Bodies, Hearts   Instruction Review Code 1- Verbalizes Understanding   Class Start Time 0813   Class Stop Time 0847   Class Time Calculation (min) 34 min    Row Name 12/20/23 0900     Education   Cardiac Education Topics [facility]   Secondary school teacher School   Educator Respiratory Therapist;Nurse   Weekly Topic Fast and Healthy Breakfasts   Instruction Review Code 1- Verbalizes Understanding   Class Start Time [phone]   Class Stop Time 0844   Class Time Calculation (min) 32 min    Row Name 12/22/23 0900     Education   Cardiac Education Topics [facility]   Select Core Videos     Core Videos   Educator Dietitian   Select Nutrition   Nutrition Other  Label Reading   Instruction Review Code 1- Verbalizes Understanding   Class Start Time 0815   Class Stop Time 0853   Class Time Calculation (min) 38 min    Row Name 12/25/23 0900     Education   Cardiac Education Topics [facility]   Select Core  Videos     Core Videos   Educator Nurse   Select Nutrition   Nutrition Becoming a [employer] Chef   Instruction Review Code 1- Verbalizes Understanding   Class Start Time 0815   Class Stop Time 0856   Class Time Calculation (min) 41 min    Row Name 12/27/23 0900     Education   Cardiac Education Topics [facility]in   Secondary school teacher School   Educator Dietitian;Respiratory Therapist   Weekly Topic Personalizing Your Pritikin Plate   Instruction Review Code 1- Verbalizes Understanding   Class Start Time 0815   Class Stop Time 0848   Class Time Calculation (min) 33 min    Row Name 01/01/24 1000     Education   Cardiac Education Topics Pritikin   [employer] Workshops     Workshops   Educator Exercise Physiologist   [facility] Psychosocial   Psychosocial Workshop Recognizing and Reducing Stress   Instruction Review Code 1- Verbalizes  Understanding    Row Name 01/01/24 1600     Education   Cardiac Education Topics Pritikin   [employer] Workshops     Workshops   Educator Exercise Physiologist   [facility] Psychosocial   Psychosocial Workshop Recognizing and Reducing Stress   Instruction Review Code 1- Verbalizes Understanding   Class Start Time 0815   Class Stop Time 0858   Class Time Calculation (min) 43 min            Core Videos: Exercise    Move It!  Clinical staff conducted group or individual video education with verbal and written material and guidebook.  Patient learns the recommended Pritikin exercise program. Exercise with the goal of living a long, healthy life. Some of the health benefits of exercise include controlled diabetes, healthier blood pressure levels, improved cholesterol levels, improved heart and lung capacity, improved sleep, and better body composition. Everyone should speak with their doctor before starting or changing an exercise routine.  Biomechanical Limitations Clinical staff conducted group or individual video education with verbal and written material and guidebook.  Patient learns how biomechanical limitations can impact exercise and how we can mitigate and possibly overcome limitations to have an impactful and balanced exercise routine.  Body Composition Clinical staff conducted group or individual video education with verbal and written material and guidebook.  Patient learns that body composition (ratio of muscle mass to fat mass) is a key component to assessing overall fitness, rather than body weight alone. Increased fat mass, especially visceral belly fat, can put [place] at increased risk for metabolic syndrome, type 2 diabetes, heart disease, and even death. It is recommended to combine diet and exercise (cardiovascular and resistance training) to improve your body composition. Seek guidance from your physician and exercise physiologist before implementing an exercise  routine.  Exercise Action Plan Clinical staff conducted group or individual video education with verbal and written material and guidebook.  Patient learns the recommended strategies to achieve and enjoy long-term exercise adherence, including variety, self-motivation, self-efficacy, and positive decision making. Benefits of exercise include fitness, good health, weight management, more energy, better sleep, less stress, and overall well-being.  Medical   Heart Disease Risk Reduction Clinical staff conducted group or individual video education with verbal and written material and guidebook.  Patient learns our heart is our most vital organ as it circulates oxygen, nutrients, white blood cells, and hormones throughout the entire body, and carries waste away. Data supports a plant-based eating plan like the Pritikin  Program for its effectiveness in slowing progression of and reversing heart disease. The video provides a number of recommendations to address heart disease.   Metabolic Syndrome and Belly Fat  Clinical staff conducted group or individual video education with verbal and written material and guidebook.  Patient learns what metabolic syndrome is, how it leads to heart disease, and how one can reverse it and keep it from coming back. You have metabolic syndrome if you have 3 of the following 5 criteria: abdominal obesity, high blood pressure, high triglycerides, low HDL cholesterol, and high blood sugar.  Hypertension and Heart Disease Clinical staff conducted group or individual video education with verbal and written material and guidebook.  Patient learns that high blood pressure, or hypertension, is very common in the [place]. Hypertension is largely due to excessive salt intake, but other important risk factors include being overweight, physical inactivity, drinking too much alcohol, smoking, and not eating enough potassium from fruits and vegetables. High blood pressure is a  leading risk factor for heart attack, stroke, congestive heart failure, dementia, kidney failure, and premature death. Long-term effects of excessive salt intake include stiffening of the arteries and thickening of heart muscle and organ damage. Recommendations include ways to reduce hypertension and the risk of heart disease.  Diseases of Our Time - Focusing on Diabetes Clinical staff conducted group or individual video education with verbal and written material and guidebook.  Patient learns why the best way to stop diseases of our time is prevention, through food and other lifestyle changes. Medicine (such as prescription pills and surgeries) is often only a Band-Aid on the problem, not a long-term solution. Most common diseases of our time include obesity, type 2 diabetes, hypertension, heart disease, and cancer. The Pritikin Program is recommended and has been proven to help reduce, reverse, and/or prevent the damaging effects of metabolic syndrome.  Nutrition   Overview of the Pritikin Eating Plan  Clinical staff conducted group or individual video education with verbal and written material and guidebook.  Patient learns about the Pritikin Eating Plan for disease risk reduction. The Pritikin Eating Plan emphasizes a wide variety of unrefined, minimally-processed carbohydrates, like fruits, vegetables, whole grains, and legumes. Go, Caution, and Stop food choices are explained. Plant-based and lean animal proteins are emphasized. Rationale provided for low sodium intake for blood pressure control, low added sugars for blood sugar stabilization, and low added fats and oils for coronary artery disease risk reduction and weight management.  Calorie Density  Clinical staff conducted group or individual video education with verbal and written material and guidebook.  Patient learns about calorie density and how it impacts the Pritikin Eating Plan. Knowing the characteristics of the food you choose will  help you decide whether those foods will lead to weight gain or weight loss, and whether you want to consume more or less of them. Weight loss is usually a side effect of the Pritikin Eating Plan because of its focus on low calorie-dense foods.  Label Reading  Clinical staff conducted group or individual video education with verbal and written material and guidebook.  Patient learns about the Pritikin recommended label reading guidelines and corresponding recommendations regarding calorie density, added sugars, sodium content, and whole grains.  Dining Out - Part 1  Clinical staff conducted group or individual video education with verbal and written material and guidebook.  Patient learns that restaurant meals can be sabotaging because they can be so high in calories, fat, sodium, and/or sugar. Patient learns recommended  strategies on how to positively address this and avoid unhealthy pitfalls.  Facts on Fats  Clinical staff conducted group or individual video education with verbal and written material and guidebook.  Patient learns that lifestyle modifications can be just as effective, if not more so, as many medications for lowering your risk of heart disease. A Pritikin lifestyle can help to reduce your risk of inflammation and atherosclerosis (cholesterol build-up, or plaque, in the artery walls). Lifestyle interventions such as dietary choices and physical activity address the cause of atherosclerosis. A review of the types of fats and their impact on blood cholesterol levels, along with dietary recommendations to reduce fat intake is also included.  Nutrition Action Plan  Clinical staff conducted group or individual video education with verbal and written material and guidebook.  Patient learns how to incorporate Pritikin recommendations into their lifestyle. Recommendations include planning and keeping personal health goals in mind as an important part of their success.  Healthy Mind-Set     Healthy Minds, Bodies, Hearts  Clinical staff conducted group or individual video education with verbal and written material and guidebook.  Patient learns how to identify when they are stressed. Video will discuss the impact of that stress, as well as the many benefits of stress management. Patient will also be introduced to stress management techniques. The way we think, act, and feel has an impact on our hearts.  How Our Thoughts Can Heal Our Hearts  Clinical staff conducted group or individual video education with verbal and written material and guidebook.  Patient learns that negative thoughts can cause depression and anxiety. This can result in negative lifestyle behavior and serious health problems. Cognitive behavioral therapy is an effective method to help control our thoughts in order to change and improve our emotional outlook.  Additional Videos:  Exercise    Improving Performance  Clinical staff conducted group or individual video education with verbal and written material and guidebook.  Patient learns to use a non-linear approach by alternating intensity levels and lengths of time spent exercising to help burn more calories and lose more body fat. Cardiovascular exercise helps improve heart health, metabolism, hormonal balance, blood sugar control, and recovery from fatigue. Resistance training improves strength, endurance, balance, coordination, reaction time, metabolism, and muscle mass. Flexibility exercise improves circulation, posture, and balance. Seek guidance from your physician and exercise physiologist before implementing an exercise routine and learn your capabilities and proper form for all exercise.  Introduction to Yoga  Clinical staff conducted group or individual video education with verbal and written material and guidebook.  Patient learns about yoga, a discipline of the coming together of mind, breath, and body. The benefits of yoga include improved flexibility,  improved range of motion, better posture and core strength, increased lung function, weight loss, and positive self-image. Yoga's heart health benefits include lowered blood pressure, healthier heart rate, decreased cholesterol and triglyceride levels, improved immune function, and reduced stress. Seek guidance from your physician and exercise physiologist before implementing an exercise routine and learn your capabilities and proper form for all exercise.  Medical   Aging: Enhancing Your Quality of Life  Clinical staff conducted group or individual video education with verbal and written material and guidebook.  Patient learns key strategies and recommendations to stay in good physical health and enhance quality of life, such as prevention strategies, having an advocate, securing a Health Care Proxy and Power of Attorney, and keeping a list of medications and system for tracking them. It also discusses  how to avoid risk for bone loss.  Biology of Weight Control  Clinical staff conducted group or individual video education with verbal and written material and guidebook.  Patient learns that weight gain occurs because we consume more calories than we burn (eating more, moving less). Even if your body weight is normal, you may have higher ratios of fat compared to muscle mass. Too much body fat puts you at increased risk for cardiovascular disease, heart attack, stroke, type 2 diabetes, and obesity-related cancers. In addition to exercise, following the Pritikin Eating Plan can help reduce your risk.  Decoding Lab Results  Clinical staff conducted group or individual video education with verbal and written material and guidebook.  Patient learns that lab test reflects one measurement whose values change over time and are influenced by many factors, including medication, stress, sleep, exercise, food, hydration, pre-existing medical conditions, and more. It is recommended to use the knowledge from this  video to become more involved with your lab results and evaluate your numbers to speak with your doctor.   Diseases of Our Time - Overview  Clinical staff conducted group or individual video education with verbal and written material and guidebook.  Patient learns that according to the CDC, 50% to 70% of chronic diseases (such as obesity, type 2 diabetes, elevated lipids, hypertension, and heart disease) are avoidable through lifestyle improvements including healthier food choices, listening to satiety cues, and increased physical activity.  Sleep Disorders Clinical staff conducted group or individual video education with verbal and written material and guidebook.  Patient learns how good quality and duration of sleep are important to overall health and well-being. Patient also learns about sleep disorders and how they impact health along with recommendations to address them, including discussing with a physician.  Nutrition  Dining Out - Part 2 Clinical staff conducted group or individual video education with verbal and written material and guidebook.  Patient learns how to plan ahead and communicate in order to maximize their dining experience in a healthy and nutritious manner. Included are recommended food choices based on the type of restaurant the patient is visiting.   Fueling a Banker conducted group or individual video education with verbal and written material and guidebook.  There is a strong connection between our food choices and our health. Diseases like obesity and type 2 diabetes are very prevalent and are in large-part due to lifestyle choices. The Pritikin Eating Plan provides plenty of food and hunger-curbing satisfaction. It is easy to follow, affordable, and helps reduce health risks.  Menu Workshop  Clinical staff conducted group or individual video education with verbal and written material and guidebook.  Patient learns that restaurant meals can  sabotage health goals because they are often packed with calories, fat, sodium, and sugar. Recommendations include strategies to plan ahead and to communicate with the manager, chef, or server to help order a healthier meal.  Planning Your Eating Strategy  Clinical staff conducted group or individual video education with verbal and written material and guidebook.  Patient learns about the Pritikin Eating Plan and its benefit of reducing the risk of disease. The Pritikin Eating Plan does not focus on calories. Instead, it emphasizes high-quality, nutrient-rich foods. By knowing the characteristics of the foods, we choose, we can determine their calorie density and make informed decisions.  Targeting Your Nutrition Priorities  Clinical staff conducted group or individual video education with verbal and written material and guidebook.  Patient learns that lifestyle habits  have a tremendous impact on disease risk and progression. This video provides eating and physical activity recommendations based on your personal health goals, such as reducing LDL cholesterol, losing weight, preventing or controlling type 2 diabetes, and reducing high blood pressure.  Vitamins and Minerals  Clinical staff conducted group or individual video education with verbal and written material and guidebook.  Patient learns different ways to obtain key vitamins and minerals, including through a recommended healthy diet. It is important to discuss all supplements you take with your doctor.   Healthy Mind-Set    Smoking Cessation  Clinical staff conducted group or individual video education with verbal and written material and guidebook.  Patient learns that cigarette smoking and tobacco addiction pose a serious health risk which affects millions of people. Stopping smoking will significantly reduce the risk of heart disease, lung disease, and many forms of cancer. Recommended strategies for quitting are covered, including  working with your doctor to develop a successful plan.  Culinary   Becoming a Set designer conducted group or individual video education with verbal and written material and guidebook.  Patient learns that cooking at home can be healthy, cost-effective, quick, and puts them in control. Keys to cooking healthy recipes will include looking at your recipe, assessing your equipment needs, planning ahead, making it simple, choosing cost-effective seasonal ingredients, and limiting the use of added fats, salts, and sugars.  Cooking - Breakfast and Snacks  Clinical staff conducted group or individual video education with verbal and written material and guidebook.  Patient learns how important breakfast is to satiety and nutrition through the entire day. Recommendations include key foods to eat during breakfast to help stabilize blood sugar levels and to prevent overeating at meals later in the day. Planning ahead is also a key component.  Cooking - Educational psychologist conducted group or individual video education with verbal and written material and guidebook.  Patient learns eating strategies to improve overall health, including an approach to cook more at home. Recommendations include thinking of animal protein as a side on your plate rather than center stage and focusing instead on lower calorie dense options like vegetables, fruits, whole grains, and plant-based proteins, such as beans. Making sauces in large quantities to freeze for later and leaving the skin on your vegetables are also recommended to maximize your experience.  Cooking - Healthy Salads and Dressing Clinical staff conducted group or individual video education with verbal and written material and guidebook.  Patient learns that vegetables, fruits, whole grains, and legumes are the foundations of the Pritikin Eating Plan. Recommendations include how to incorporate each of these in flavorful and healthy  salads, and how to create homemade salad dressings. Proper handling of ingredients is also covered. Cooking - Soups and [employer] - Soups and Desserts Clinical staff conducted group or individual video education with verbal and written material and guidebook.  Patient learns that Pritikin soups and desserts make for easy, nutritious, and delicious snacks and meal components that are low in sodium, fat, sugar, and calorie density, while high in vitamins, minerals, and filling fiber. Recommendations include simple and healthy ideas for soups and desserts.   Overview     The Pritikin Solution Program Overview Clinical staff conducted group or individual video education with verbal and written material and guidebook.  Patient learns that the results of the Pritikin Program have been documented in more than 100 articles published in peer-reviewed journals, and the benefits  include reducing risk factors for (and, in some cases, even reversing) high cholesterol, high blood pressure, type 2 diabetes, obesity, and more! An overview of the three key pillars of the Pritikin Program will be covered: eating well, doing regular exercise, and having a healthy mind-set.  WORKSHOPS  Exercise: Exercise Basics: Building Your Action Plan Clinical staff led group instruction and group discussion with PowerPoint presentation and patient guidebook. To enhance the learning environment the use of posters, models and videos may be added. At the conclusion of this workshop, patients will comprehend the difference between physical activity and exercise, as well as the benefits of incorporating both, into their routine. Patients will understand the FITT (Frequency, Intensity, Time, and Type) principle and how to use it to build an exercise action plan. In addition, safety concerns and other considerations for exercise and cardiac rehab will be addressed by the presenter. The purpose of this lesson is to promote a  comprehensive and effective weekly exercise routine in order to improve patients' overall level of fitness.   Managing Heart Disease: Your Path to a Healthier Heart Clinical staff led group instruction and group discussion with PowerPoint presentation and patient guidebook. To enhance the learning environment the use of posters, models and videos may be added.At the conclusion of this workshop, patients will understand the anatomy and physiology of the heart. Additionally, they will understand how Pritikin's three pillars impact the risk factors, the progression, and the management of heart disease.  The purpose of this lesson is to provide a high-level overview of the heart, heart disease, and how the Pritikin lifestyle positively impacts risk factors.  Exercise Biomechanics Clinical staff led group instruction and group discussion with PowerPoint presentation and patient guidebook. To enhance the learning environment the use of posters, models and videos may be added. Patients will learn how the structural parts of their bodies function and how these functions impact their daily activities, movement, and exercise. Patients will learn how to promote a neutral spine, learn how to manage pain, and identify ways to improve their physical movement in order to promote healthy living. The purpose of this lesson is to expose patients to common physical limitations that impact physical activity. Participants will learn practical ways to adapt and manage aches and pains, and to minimize their effect on regular exercise. Patients will learn how to maintain good posture while sitting, walking, and lifting.  Balance Training and Fall Prevention  Clinical staff led group instruction and group discussion with PowerPoint presentation and patient guidebook. To enhance the learning environment the use of posters, models and videos may be added. At the conclusion of this workshop, patients will understand the  importance of their sensorimotor skills (vision, proprioception, and the vestibular system) in maintaining their ability to balance as they age. Patients will apply a variety of balancing exercises that are appropriate for their current level of function. Patients will understand the common causes for poor balance, possible solutions to these problems, and ways to modify their physical environment in order to minimize their fall risk. The purpose of this lesson is to teach patients about the importance of maintaining balance as they age and ways to minimize their risk of falling.  WORKSHOPS   Nutrition:  Fueling a Ship broker led group instruction and group discussion with PowerPoint presentation and patient guidebook. To enhance the learning environment the use of posters, models and videos may be added. Patients will review the foundational principles of the Pritikin Eating Plan and understand  what constitutes a serving size in each of the food groups. Patients will also learn Pritikin-friendly foods that are better choices when away from home and review make-ahead meal and snack options. Calorie density will be reviewed and applied to three nutrition priorities: weight maintenance, weight loss, and weight gain. The purpose of this lesson is to reinforce (in a group setting) the key concepts around what patients are recommended to eat and how to apply these guidelines when away from home by planning and selecting Pritikin-friendly options. Patients will understand how calorie density may be adjusted for different weight management goals.  Mindful Eating  Clinical staff led group instruction and group discussion with PowerPoint presentation and patient guidebook. To enhance the learning environment the use of posters, models and videos may be added. Patients will briefly review the concepts of the Pritikin Eating Plan and the importance of low-calorie dense foods. The concept of mindful  eating will be introduced as well as the importance of paying attention to internal hunger signals. Triggers for non-hunger eating and techniques for dealing with triggers will be explored. The purpose of this lesson is to provide patients with the opportunity to review the basic principles of the Pritikin Eating Plan, discuss the value of eating mindfully and how to measure internal cues of hunger and fullness using the Hunger Scale. Patients will also discuss reasons for non-hunger eating and learn strategies to use for controlling emotional eating.  Targeting Your Nutrition Priorities Clinical staff led group instruction and group discussion with PowerPoint presentation and patient guidebook. To enhance the learning environment the use of posters, models and videos may be added. Patients will learn how to determine their genetic susceptibility to disease by reviewing their family history. Patients will gain insight into the importance of diet as part of an overall healthy lifestyle in mitigating the impact of genetics and other environmental insults. The purpose of this lesson is to provide patients with the opportunity to assess their personal nutrition priorities by looking at their family history, their own health history and current risk factors. Patients will also be able to discuss ways of prioritizing and modifying the Pritikin Eating Plan for their highest risk areas  Menu  Clinical staff led group instruction and group discussion with PowerPoint presentation and patient guidebook. To enhance the learning environment the use of posters, models and videos may be added. Using menus brought in from [employer], or printed from [employer], patients will apply the Pritikin dining out guidelines that were presented in the [employer] video. Patients will also be able to practice these guidelines in a variety of provided scenarios. The purpose of this lesson is to provide  patients with the opportunity to practice hands-on learning of the Pritikin Dining Out guidelines with actual menus and practice scenarios.  Label Reading Clinical staff led group instruction and group discussion with PowerPoint presentation and patient guidebook. To enhance the learning environment the use of posters, models and videos may be added. Patients will review and discuss the Pritikin label reading guidelines presented in [employer]ritikin's Label Reading Educational series video. Using fool labels brought in from local grocery stores and markets, patients will apply the label reading guidelines and determine if the packaged food meet the Pritikin guidelines. The purpose of this lesson is to provide patients with the opportunity to review, discuss, and practice hands-on learning of the Pritikin Label Reading guidelines with actual packaged food labels. Cooking School  Pritikin's [employer] are designed to teach  patients ways to prepare quick, simple, and affordable recipes at home. The importance of nutrition's role in chronic disease risk reduction is reflected in its emphasis in the overall Pritikin program. By learning how to prepare essential core Pritikin Eating Plan recipes, patients will increase control over what they eat; be able to customize the flavor of foods without the use of added salt, sugar, or fat; and improve the quality of the food they consume. By learning a set of core recipes which are easily assembled, quickly prepared, and affordable, patients are more likely to prepare more healthy foods at home. These workshops focus on convenient breakfasts, simple entres, side dishes, and desserts which can be prepared with minimal effort and are consistent with nutrition recommendations for cardiovascular risk reduction. Cooking [employer] are taught by a Armed forces logistics/support/administrative officer (RD) who has been trained by the [employer]. The chef or RD has a clear  understanding of the importance of minimizing - if not completely eliminating - added fat, sugar, and sodium in recipes. Throughout the series of Cooking School Workshop sessions, patients will learn about healthy ingredients and efficient methods of cooking to build confidence in their capability to prepare    Cooking School weekly topics:  Adding Flavor- Sodium-Free  Fast and Healthy Breakfasts  Powerhouse Plant-Based Proteins  Satisfying Salads and Dressings  Simple Sides and Sauces  International Cuisine-Spotlight on the [employer] Zones  Delicious Desserts  Savory Soups  [employer] - Meals in a Astronomer Appetizers and Snacks  Comforting Weekend Breakfasts  One-Pot Wonders   Fast Evening Meals  Landscape architect Your Pritikin Plate  WORKSHOPS   Healthy Mindset (Psychosocial):  Focused Goals, Sustainable Changes Clinical staff led group instruction and group discussion with PowerPoint presentation and patient guidebook. To enhance the learning environment the use of posters, models and videos may be added. Patients will be able to apply effective goal setting strategies to establish at least one personal goal, and then take consistent, meaningful action toward that goal. They will learn to identify common barriers to achieving personal goals and develop strategies to overcome them. Patients will also gain an understanding of how our mind-set can impact our ability to achieve goals and the importance of cultivating a positive and growth-oriented mind-set. The purpose of this lesson is to provide patients with a deeper understanding of how to set and achieve personal goals, as well as the tools and strategies needed to overcome common obstacles which may arise along the way.  From Head to Heart: The Power of a Healthy Outlook  Clinical staff led group instruction and group discussion with PowerPoint presentation and patient guidebook. To enhance the learning  environment the use of posters, models and videos may be added. Patients will be able to recognize and describe the impact of emotions and mood on physical health. They will discover the importance of self-care and explore self-care practices which may work for them. Patients will also learn how to utilize the 4 C's to cultivate a healthier outlook and better manage stress and challenges. The purpose of this lesson is to demonstrate to patients how a healthy outlook is an essential part of maintaining good health, especially as they continue their cardiac rehab journey.  Healthy Sleep for a Healthy Heart Clinical staff led group instruction and group discussion with PowerPoint presentation and patient guidebook. To enhance the learning environment the use of posters, models and videos may be added. At the conclusion of this  workshop, patients will be able to demonstrate knowledge of the importance of sleep to overall health, well-being, and quality of life. They will understand the symptoms of, and treatments for, common sleep disorders. Patients will also be able to identify daytime and nighttime behaviors which impact sleep, and they will be able to apply these tools to help manage sleep-related challenges. The purpose of this lesson is to provide patients with a general overview of sleep and outline the importance of quality sleep. Patients will learn about a few of the most common sleep disorders. Patients will also be introduced to the concept of "sleep hygiene," and discover ways to self-manage certain sleeping problems through simple daily behavior changes. Finally, the workshop will motivate patients by clarifying the links between quality sleep and their goals of heart-healthy living.   Recognizing and Reducing Stress Clinical staff led group instruction and group discussion with PowerPoint presentation and patient guidebook. To enhance the learning environment the use of posters, models and videos  may be added. At the conclusion of this workshop, patients will be able to understand the types of stress reactions, differentiate between acute and chronic stress, and recognize the impact that chronic stress has on their health. They will also be able to apply different coping mechanisms, such as reframing negative self-talk. Patients will have the opportunity to practice a variety of stress management techniques, such as deep abdominal breathing, progressive muscle relaxation, and/or guided imagery.  The purpose of this lesson is to educate patients on the role of stress in their lives and to provide healthy techniques for coping with it.  Learning Barriers/Preferences:  Learning Barriers/Preferences - 11/22/23 1610       Learning Barriers/Preferences   Learning Barriers Sight   wears glasses   Learning Preferences Audio;Computer/Internet;Group Instruction;Individual Instruction;Skilled Demonstration;Pictoral;Verbal Instruction;Video;Written Material             Education Topics:  Knowledge Questionnaire Score:  Knowledge Questionnaire Score - 11/22/23 0834       Knowledge Questionnaire Score   Pre Score 24/24             Core Components/Risk Factors/Patient Goals at Admission:  Personal Goals and Risk Factors at Admission - 11/22/23 0831       Core Components/Risk Factors/Patient Goals on Admission    Weight Management Yes;Weight Maintenance    Intervention Weight Management: Develop a combined nutrition and exercise program designed to reach desired caloric intake, while maintaining appropriate intake of nutrient and fiber, sodium and fats, and appropriate energy expenditure required for the weight goal.;Weight Management: Provide education and appropriate resources to help participant work on and attain dietary goals.    Expected Outcomes Short Term: Continue to assess and modify interventions until short term weight is achieved;Long Term: Adherence to nutrition and  physical activity/exercise program aimed toward attainment of established weight goal;Weight Maintenance: Understanding of the daily nutrition guidelines, which includes 25-35% calories from fat, 7% or less cal from saturated fats, less than 200mg  cholesterol, less than 1.5gm of sodium, & 5 or more servings of fruits and vegetables daily;Understanding recommendations for meals to include 15-35% energy as protein, 25-35% energy from fat, 35-60% energy from carbohydrates, less than 200mg  of dietary cholesterol, 20-35 gm of total fiber daily;Understanding of distribution of calorie intake throughout the day with the consumption of 4-5 meals/snacks    Hypertension Yes    Intervention Provide education on lifestyle modifcations including regular physical activity/exercise, weight management, moderate sodium restriction and increased consumption of fresh fruit, vegetables, and low  fat dairy, alcohol moderation, and smoking cessation.;Monitor prescription use compliance.    Expected Outcomes Short Term: Continued assessment and intervention until BP is < 140/90mm HG in hypertensive participants. < 130/20mm HG in hypertensive participants with diabetes, heart failure or chronic kidney disease.;Long Term: Maintenance of blood pressure at goal levels.             Core Components/Risk Factors/Patient Goals Review:   Goals and Risk Factor Review     Row Name 11/28/23 0740 11/30/23 1601 01/02/24 0919         Core Components/Risk Factors/Patient Goals Review   Personal Goals Review Weight Management/Obesity;Hypertension Weight Management/Obesity;Hypertension Weight Management/Obesity;Hypertension     Review Lliam started cardiac rehab on 11/28/23. Bardia did well with exercise. Vital signs were stable. Ryden started cardiac rehab on 11/28/23. Dewayne is off to a good start  with exercise. Vital signs have been stable. Taiga is doing well  with exercise. at cardiac rehab, Uziah has increased his met levels. Arlo  feels like he is "getting back to his old self". Treyden has maintained sinus rhythm and has lost 1.4 kg since starting cardiac rehab.     Expected Outcomes Istvan will continue to participate in cardiac rehab for exercise, nutrition and lifestyle modifications. Montay will continue to participate in cardiac rehab for exercise, nutrition and lifestyle modifications. Chayne will continue to participate in cardiac rehab for exercise, nutrition and lifestyle modifications.              Core Components/Risk Factors/Patient Goals at Discharge (Final Review):   Goals and Risk Factor Review - 01/02/24 0919       Core Components/Risk Factors/Patient Goals Review   Personal Goals Review Weight Management/Obesity;Hypertension    Review Ilario is doing well  with exercise. at cardiac rehab, Nikalas has increased his met levels. Lancelot feels like he is "getting back to his old self". Javaughn has maintained sinus rhythm and has lost 1.4 kg since starting cardiac rehab.    Expected Outcomes Firmin will continue to participate in cardiac rehab for exercise, nutrition and lifestyle modifications.             ITP Comments:  ITP Comments     Row Name 11/22/23 0744 11/27/23 1600 11/30/23 0903 01/02/24 0917     ITP Comments Dr. Armanda Magic medical director. Introduction to pritikin education/intensive cardiac rehab. Initial orientation packet reviewed with patient. 30 Day ITP Review. Bartow started cardiac rehab on 11/27/23. Derin did well with exercise. 30 Day ITP Review. Quintrell started cardiac rehab on 11/27/23. Wilmore is off to a good start  with exercise. 30 Day ITP Review. Oley has good attendance and participation with exercise at  cardiac rehab             Comments: See ITP comments.Thayer Headings RN BSN

## 2024-01-03 ENCOUNTER — Encounter (HOSPITAL_COMMUNITY)
Admission: RE | Admit: 2024-01-03 | Discharge: 2024-01-03 | Disposition: A | Discharge status: Home / Self Care | Payer: Medicare HMO | Source: Ambulatory Visit | Attending: Cardiology

## 2024-01-03 ENCOUNTER — Encounter (HOSPITAL_COMMUNITY): Payer: Medicare HMO

## 2024-01-03 DIAGNOSIS — I4892 Unspecified atrial flutter: Secondary | ICD-10-CM | POA: Diagnosis not present

## 2024-01-03 DIAGNOSIS — Z9889 Other specified postprocedural states: Secondary | ICD-10-CM

## 2024-01-05 ENCOUNTER — Encounter (HOSPITAL_COMMUNITY)
Admission: RE | Admit: 2024-01-05 | Discharge: 2024-01-05 | Disposition: A | Discharge status: Home / Self Care | Payer: Medicare HMO | Source: Ambulatory Visit | Attending: Cardiology | Admitting: Cardiology

## 2024-01-05 ENCOUNTER — Encounter (HOSPITAL_COMMUNITY): Payer: Medicare HMO

## 2024-01-05 DIAGNOSIS — Z9889 Other specified postprocedural states: Secondary | ICD-10-CM

## 2024-01-05 DIAGNOSIS — I4892 Unspecified atrial flutter: Secondary | ICD-10-CM | POA: Diagnosis not present

## 2024-01-08 ENCOUNTER — Encounter (HOSPITAL_COMMUNITY)
Admission: RE | Admit: 2024-01-08 | Discharge: 2024-01-08 | Disposition: A | Discharge status: Home / Self Care | Payer: Medicare HMO | Source: Ambulatory Visit | Attending: Cardiology | Admitting: Cardiology

## 2024-01-08 ENCOUNTER — Encounter (HOSPITAL_COMMUNITY): Payer: Medicare HMO

## 2024-01-08 DIAGNOSIS — Z9889 Other specified postprocedural states: Secondary | ICD-10-CM

## 2024-01-08 DIAGNOSIS — I4892 Unspecified atrial flutter: Secondary | ICD-10-CM | POA: Diagnosis not present

## 2024-01-10 ENCOUNTER — Encounter (HOSPITAL_COMMUNITY): Payer: Medicare HMO

## 2024-01-10 ENCOUNTER — Encounter (HOSPITAL_COMMUNITY)
Admission: RE | Admit: 2024-01-10 | Discharge: 2024-01-10 | Disposition: A | Discharge status: Home / Self Care | Payer: Medicare HMO | Source: Ambulatory Visit | Attending: Cardiology

## 2024-01-10 DIAGNOSIS — I4892 Unspecified atrial flutter: Secondary | ICD-10-CM | POA: Diagnosis not present

## 2024-01-10 DIAGNOSIS — Z9889 Other specified postprocedural states: Secondary | ICD-10-CM

## 2024-01-12 ENCOUNTER — Encounter (HOSPITAL_COMMUNITY): Payer: Medicare HMO

## 2024-01-15 ENCOUNTER — Encounter (HOSPITAL_COMMUNITY): Payer: Medicare HMO

## 2024-01-16 ENCOUNTER — Encounter: Payer: Self-pay | Admitting: Cardiology

## 2024-01-16 ENCOUNTER — Ambulatory Visit: Payer: Medicare HMO | Attending: Cardiology | Admitting: Cardiology

## 2024-01-16 VITALS — BP 94/70 | HR 83 | Ht 76.0 in | Wt 189.8 lb

## 2024-01-16 DIAGNOSIS — Z8679 Personal history of other diseases of the circulatory system: Secondary | ICD-10-CM | POA: Diagnosis not present

## 2024-01-16 DIAGNOSIS — I480 Paroxysmal atrial fibrillation: Secondary | ICD-10-CM

## 2024-01-16 DIAGNOSIS — E785 Hyperlipidemia, unspecified: Secondary | ICD-10-CM | POA: Diagnosis not present

## 2024-01-16 DIAGNOSIS — I4892 Unspecified atrial flutter: Secondary | ICD-10-CM | POA: Diagnosis not present

## 2024-01-16 DIAGNOSIS — Z9889 Other specified postprocedural states: Secondary | ICD-10-CM

## 2024-01-16 DIAGNOSIS — D6869 Other thrombophilia: Secondary | ICD-10-CM | POA: Diagnosis not present

## 2024-01-16 NOTE — Patient Instructions (Signed)
 Medication Instructions:  The current medical regimen is effective;  continue present plan and medications.  *If you need a refill on your cardiac medications before your next appointment, please call your pharmacy*  Follow-Up: At [facility], you and your health needs are our priority.  As part of our continuing mission to provide you with exceptional heart care, we have created designated Provider Care Teams.  These Care Teams include your primary Cardiologist (physician) and Advanced Practice Providers (APPs -  Physician Assistants and Nurse Practitioners) who all work together to provide you with the care you need, when you need it.  We recommend signing up for the patient portal called "MyChart".  Sign up information is provided on this After Visit Summary.  MyChart is used to connect with patients for Virtual Visits (Telemedicine).  Patients are able to view lab/test results, encounter notes, upcoming appointments, etc.  Non-urgent messages can be sent to your provider as well.   To learn more about what you can do with MyChart, go to [email or web address].    Your next appointment:   6 month(s)  Provider:   Donato Schultz, MD       1st Floor: - Lobby - Registration  - Pharmacy  - Lab - Cafe  2nd Floor: - PV Lab - Diagnostic Testing (echo, CT, nuclear med)  3rd Floor: - Vacant  4th Floor: - TCTS (cardiothoracic surgery) - AFib Clinic - Structural Heart Clinic - Vascular Surgery  - Vascular Ultrasound  5th Floor: - HeartCare Cardiology (general and EP) - Clinical Pharmacy for coumadin, hypertension, lipid, weight-loss medications, and med management appointments    Valet parking services will be available as well.

## 2024-01-16 NOTE — Progress Notes (Signed)
 Cardiology Office Note:  .   Date:  01/16/2024  ID:  Jeffrey Galea, MD, DOB [birth year], MRN [id number] PCP: Kirby Funk, MD (Inactive)  [facility] Providers Cardiologist:  Donato Schultz, MD Electrophysiologist:  Nobie Putnam, MD     History of Present Illness: .   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 He was referred by Dr. Jimmey Ralph, an electrophysiologist, for recurrent postoperative atrial flutter.  He underwent root replacement and aortic valve resuspension on October 09, 2023, at [facility]. Echo on October 12, 2023, showed mildly decreased right ventricular function with mild right ventricular dilatation. He underwent cardioversion here on December 15, 2023, and has been maintaining sinus rhythm since then. He continues to take Eliquis 5 mg twice a day for chronic anticoagulation.  He notices increased activity, which rhythm strips suggest are PVCs, a new occurrence for him. He has a history of atrial bigeminy as a child, which resolved in his thirties and forties. His resting heart rate is faster than it used to be, currently around 80 bpm, whereas it was previously around 50 bpm.  He is currently participating in cardiac rehabilitation and follows a Pritikin diet. He takes rosuvastatin 10 mg twice a week on Wednesdays and Sundays, and aspirin 81 mg daily. He reports improved shortness of breath and experiences some orthostasis. His hemoglobin was 10.3 in December, which may still be impacting his exercise capacity. He rides a stationary bike and has been able to reach speeds close to his baseline.  His mother and sister also take  Eliquis. He has a history of atrial enlargement noted years ago and is curious about potential cardiac remodeling post-surgery.      ROS: No CP, no SOB  Studies Reviewed: .        Results LABS LDL: 62 (07/2019) Hb: 14.7 (07/2019) Hb: 10.3 (10/19/2023)  DIAGNOSTIC Echocardiogram: Mildly decreased right ventricular function with mild right ventricular dilatation (10/12/2023) Cardioversion: Maintaining sinus rhythm (12/15/2023) Risk Assessment/Calculations:            Physical Exam:   VS:  BP 94/70   Pulse 83   Ht 6\' 4"  (1.93 m)   Wt 189 lb 12.8 oz (86.1 kg)   SpO2 99%   BMI 23.10 kg/m    Wt Readings from Last 3 Encounters:  01/16/24 189 lb 12.8 oz (86.1 kg)  12/26/23 183 lb (83 kg)  12/12/23 191 lb (86.6 kg)    GEN: Well nourished, well developed in no acute distress NECK: No JVD; No carotid bruits CARDIAC: RRR, no murmurs, no rubs, no gallops, scar well healed RESPIRATORY:  Clear to auscultation without rales, wheezing or rhonchi  ABDOMEN: Soft, non-tender, non-distended EXTREMITIES:  No edema; No deformity   ASSESSMENT AND PLAN: .    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 Cardioversion on December 15, 2023, successfully restored sinus rhythm. Atrial flutter may be secondary to postoperative inflammation, as he is still within the 90-day postoperative window. He prefers catheter ablation if atrial flutter recurs outside of this window. - Consider catheter ablation if atrial flutter recurs outside the 90-day postoperative  window - Continue Eliquis 5 mg twice daily for chronic anticoagulation  Postoperative Care after Aortic Root Replacement and Valve Resuspension Recovering from aortic root replacement and valve resuspension performed on October 09, 2023. Reports improved shortness of breath and no pain. Experiencing a faster resting heart rate than  usual, likely due to disruptions in nerve fibers post-surgery. Participating in cardiac rehabilitation and encouraged to continue while available. Experiencing PVCs, common post-surgery. - Continue cardiac rehabilitation - Monitor heart rate and rhythm - Encourage hydration and salt intake to manage orthostasis - Review echocardiogram next week to assess cardiac remodeling and valve function  Hyperlipidemia LDL was 62 in September 2020 while on rosuvastatin 10 mg twice a week. Continues this regimen. - Continue rosuvastatin 10 mg twice a week  Follow-up Echocardiogram next week to assess cardiac remodeling and aortic valve function. Follow-up with Dr. Jimmey Ralph is planned. - Schedule a six-month follow-up appointment - Follow up with Dr. Jimmey Ralph as planned           Signed, Donato Schultz, MD

## 2024-01-17 ENCOUNTER — Encounter (HOSPITAL_COMMUNITY)
Admission: RE | Admit: 2024-01-17 | Discharge: 2024-01-17 | Disposition: A | Discharge status: Home / Self Care | Payer: Medicare HMO | Source: Ambulatory Visit | Attending: Cardiology

## 2024-01-17 ENCOUNTER — Encounter (HOSPITAL_COMMUNITY): Payer: Medicare HMO

## 2024-01-17 DIAGNOSIS — I4892 Unspecified atrial flutter: Secondary | ICD-10-CM | POA: Diagnosis not present

## 2024-01-17 DIAGNOSIS — Z9889 Other specified postprocedural states: Secondary | ICD-10-CM

## 2024-01-19 ENCOUNTER — Encounter (HOSPITAL_COMMUNITY): Payer: Medicare HMO

## 2024-01-19 ENCOUNTER — Encounter (HOSPITAL_COMMUNITY)
Admission: RE | Admit: 2024-01-19 | Discharge: 2024-01-19 | Disposition: A | Discharge status: Home / Self Care | Payer: Medicare HMO | Source: Ambulatory Visit | Attending: Cardiology | Admitting: Cardiology

## 2024-01-19 DIAGNOSIS — I4892 Unspecified atrial flutter: Secondary | ICD-10-CM | POA: Diagnosis not present

## 2024-01-19 DIAGNOSIS — Z9889 Other specified postprocedural states: Secondary | ICD-10-CM

## 2024-01-22 ENCOUNTER — Encounter (HOSPITAL_COMMUNITY): Payer: Medicare HMO

## 2024-01-22 ENCOUNTER — Encounter (HOSPITAL_COMMUNITY)
Admission: RE | Admit: 2024-01-22 | Discharge: 2024-01-22 | Disposition: A | Discharge status: Home / Self Care | Payer: Medicare HMO | Source: Ambulatory Visit | Attending: Cardiology | Admitting: Cardiology

## 2024-01-22 DIAGNOSIS — Z9889 Other specified postprocedural states: Secondary | ICD-10-CM

## 2024-01-22 DIAGNOSIS — I4892 Unspecified atrial flutter: Secondary | ICD-10-CM

## 2024-01-24 ENCOUNTER — Ambulatory Visit (HOSPITAL_BASED_OUTPATIENT_CLINIC_OR_DEPARTMENT_OTHER): Payer: Medicare HMO

## 2024-01-24 ENCOUNTER — Encounter: Payer: Self-pay | Admitting: Cardiology

## 2024-01-24 ENCOUNTER — Encounter (HOSPITAL_COMMUNITY): Payer: Medicare HMO

## 2024-01-24 ENCOUNTER — Encounter (HOSPITAL_COMMUNITY): Admission: RE | Admit: 2024-01-24 | Payer: Medicare HMO | Source: Ambulatory Visit

## 2024-01-24 ENCOUNTER — Telehealth (HOSPITAL_COMMUNITY): Payer: Self-pay

## 2024-01-24 DIAGNOSIS — I359 Nonrheumatic aortic valve disorder, unspecified: Secondary | ICD-10-CM | POA: Diagnosis not present

## 2024-01-24 DIAGNOSIS — I4892 Unspecified atrial flutter: Secondary | ICD-10-CM

## 2024-01-24 LAB — ECHOCARDIOGRAM COMPLETE
AR max vel: 1.87 cm2
AV Area VTI: 1.92 cm2
AV Area mean vel: 1.67 cm2
AV Mean grad: 11 mmHg
AV Peak grad: 17.5 mmHg
Ao pk vel: 2.09 m/s
Area-P 1/2: 3.68 cm2
P 1/2 time: 769 ms
S' Lateral: 3.3 cm

## 2024-01-24 NOTE — Telephone Encounter (Signed)
 Patient left message stating they were calling out for 8:15am class due to doctor's appt.

## 2024-01-26 ENCOUNTER — Encounter (HOSPITAL_COMMUNITY): Payer: Medicare HMO

## 2024-01-26 ENCOUNTER — Encounter (HOSPITAL_COMMUNITY)
Admission: RE | Admit: 2024-01-26 | Discharge: 2024-01-26 | Disposition: A | Discharge status: Home / Self Care | Payer: Medicare HMO | Source: Ambulatory Visit | Attending: Cardiology | Admitting: Cardiology

## 2024-01-26 DIAGNOSIS — Z9889 Other specified postprocedural states: Secondary | ICD-10-CM

## 2024-01-26 DIAGNOSIS — I4892 Unspecified atrial flutter: Secondary | ICD-10-CM

## 2024-01-29 ENCOUNTER — Encounter (HOSPITAL_COMMUNITY)
Admission: RE | Admit: 2024-01-29 | Discharge: 2024-01-29 | Disposition: A | Discharge status: Home / Self Care | Payer: Medicare HMO | Source: Ambulatory Visit | Attending: Cardiology | Admitting: Cardiology

## 2024-01-29 ENCOUNTER — Encounter (HOSPITAL_COMMUNITY): Payer: Medicare HMO

## 2024-01-29 DIAGNOSIS — Z9889 Other specified postprocedural states: Secondary | ICD-10-CM

## 2024-01-29 DIAGNOSIS — I4892 Unspecified atrial flutter: Secondary | ICD-10-CM | POA: Diagnosis not present

## 2024-01-29 NOTE — Progress Notes (Signed)
 Cardiac Individual Treatment Plan  Patient Details  Name: Jeffrey Dumond, MD MRN: [id number] Date of Birth: [birth year] Referring Provider:   Flowsheet Row INTENSIVE CARDIAC REHAB ORIENT from 11/22/2023 in [facility]  Referring Provider Donato Schultz, MD       Initial Encounter Date:  Flowsheet Row INTENSIVE CARDIAC REHAB ORIENT from 11/22/2023 in [facility]  Date 11/22/23       Visit Diagnosis: 10/08/24 S/P aortic valve repair  Patient's Home Medications on Admission:  Current Outpatient Medications:    apixaban (ELIQUIS) 5 MG TABS tablet, Take 1 tablet (5 mg total) by mouth 2 (two) times daily., Disp: 60 tablet, Rfl: 4   aspirin 81 MG chewable tablet, Chew 81 mg by mouth in the morning., Disp: , Rfl:    FLUoxetine (PROZAC) 10 MG tablet, Take 10 mg by mouth daily., Disp: , Rfl:    Multiple Vitamin (MULTIVITAMIN WITH MINERALS) TABS tablet, Take 1 tablet by mouth daily., Disp: , Rfl:    Omeprazole 20 MG TBEC, Take 20 mg by mouth daily before breakfast., Disp: , Rfl:    rosuvastatin (CRESTOR) 10 MG tablet, Take 10 mg by mouth 2 (two) times a week. Wednesdays & Sundays, Disp: , Rfl:   Past Medical History: Past Medical History:  Diagnosis Date   Anxiety    Cancer (HCC)    skin cancers/melanoma and basal cell.   Depression    Dyspepsia    Dysthymia    Family history of prostate cancer    Gilbert's syndrome    Glaucoma    Heartburn    History of adenomatous polyp of colon    History of melanoma    Hyperlipidemia    Liver hemangioma    Low HDL (under 40)    Microcytic anemia    Migraine headache    Nephrolithiasis    Palpitations    Primary open angle glaucoma of both eyes, unspecified glaucoma stage    Pure hypercholesterolemia    Tinnitus of both ears     Tobacco Use: Social History   Tobacco Use  Smoking Status Never  Smokeless Tobacco Never     Labs: Review Flowsheet        No data to display          Capillary Blood Glucose: No results found for: "GLUCAP"   Exercise Target Goals: Exercise Program Goal: Individual exercise prescription set using results from initial 6 min walk test and THRR while considering  patient's activity barriers and safety.   Exercise Prescription Goal: Initial exercise prescription builds to 30-45 minutes a day of aerobic activity, 2-3 days per week.  Home exercise guidelines will be given to patient during program as part of exercise prescription that the participant will acknowledge.  Activity Barriers & Risk Stratification:  Activity Barriers & Cardiac Risk Stratification - 11/22/23 0823       Activity Barriers & Cardiac Risk Stratification   Activity Barriers Other (comment)    Comments sternal precautions    Cardiac Risk Stratification High   <5 METs on            6 Minute Walk:  6 Minute Walk     Row Name 11/22/23 1003         6 Minute Walk   Phase Initial     Distance 1820 feet     Walk Time 6 minutes     #  of Rest Breaks 0     MPH 3.45     METS 4.36     RPE 10     Perceived Dyspnea  1.5     VO2 Peak 15.26     Symptoms Yes (comment)     Comments 2/10 incision/sternal pain, SOB. Resolved with rest     Resting HR 60 bpm     Resting BP 112/58     Resting Oxygen Saturation  98 %     Exercise Oxygen Saturation  during 6 min walk 96 %     Max Ex. HR 95 bpm     Max Ex. BP 146/60     2 Minute Post BP 128/62              Oxygen Initial Assessment:   Oxygen Re-Evaluation:   Oxygen Discharge (Final Oxygen Re-Evaluation):   Initial Exercise Prescription:  Initial Exercise Prescription - 11/22/23 1000       Date of Initial Exercise RX and Referring Provider   Date 11/22/23    Referring Provider Donato Schultz, MD    Expected Discharge Date 02/14/24      Treadmill   MPH 3.2    Grade 0    Minutes 15    METs 3.5      Recumbant Elliptical    Level 2    RPM 60    Watts 90    Minutes 15    METs 3.5      Prescription Details   Frequency (times per week) 3    Duration Progress to 30 minutes of continuous aerobic without signs/symptoms of physical distress      Intensity   THRR 40-80% of Max Heartrate 60-120    Ratings of Perceived Exertion 11-13    Perceived Dyspnea 0-4      Progression   Progression Continue progressive overload as per policy without signs/symptoms or physical distress.      Resistance Training   Training Prescription Yes    Weight 4    Reps 10-15             Perform Capillary Blood Glucose checks as needed.  Exercise Prescription Changes:   Exercise Prescription Changes     Row Name 11/27/23 1400 12/22/23 1600 01/17/24 1611 01/26/24 1100       Response to Exercise   Blood Pressure (Admit) 110/58 110/70 104/68 102/64    Blood Pressure (Exercise) 116/64 126/60 -- --    Blood Pressure (Exit) 104/52 104/56 94/54 108/58    Heart Rate (Admit) 64 bpm 68 bpm 71 bpm 70 bpm    Heart Rate (Exercise) 74 bpm 131 bpm 137 bpm 143 bpm    Heart Rate (Exit) 58 bpm 80 bpm 79 bpm 85 bpm    Rating of Perceived Exertion (Exercise) 10 11 11 12     Symptoms None None None None    Comments Pt's first day in the CRP2 program Reviewed METs, goals and Home exercise Rx Reviewed METs and goals Reviewed METs    Duration Continue with 30 min of aerobic exercise without signs/symptoms of physical distress. Continue with 30 min of aerobic exercise without signs/symptoms of physical distress. Continue with 30 min of aerobic exercise without signs/symptoms of physical distress. Continue with 30 min of aerobic exercise without signs/symptoms of physical distress.    Intensity THRR unchanged THRR unchanged THRR unchanged THRR unchanged      Progression   Progression Continue to progress workloads to maintain intensity without signs/symptoms of physical  distress. Continue to progress workloads to maintain intensity without  signs/symptoms of physical distress. Continue to progress workloads to maintain intensity without signs/symptoms of physical distress. Continue to progress workloads to maintain intensity without signs/symptoms of physical distress.    Average METs 4.9 7.6 8.85 9.1      Resistance Training   Training Prescription Yes Yes No No    Weight 4 4 lbs No weights on wednesdays 6 lbs    Reps 10-15 10-15 -- 10-15    Time 10 Minutes 10 Minutes -- 10 Minutes      Interval Training   Interval Training No No No No      NuStep   Level 2 4 5  --    SPM --  No mets of SPM 164 147 --    Minutes 15 15 15  --    METs -- 9 9.2 --      Recumbant Elliptical   Level 2 4 -- --    RPM 71 86 -- --    Watts 105 140 -- --    Minutes 15 15 -- --    METs 4.9 6.2 -- --      T5 Nustep   Level -- -- -- 7    Minutes -- -- -- 15    METs -- -- -- 9.2      Rower   Level -- -- 5 5    Watts -- -- 130 142    Minutes -- -- 15 15    METs -- -- 8.51 8.98      Home Exercise Plan   Plans to continue exercise at -- Home (comment) Home (comment) Home (comment)    Frequency -- Add 3 additional days to program exercise sessions. Add 3 additional days to program exercise sessions. Add 3 additional days to program exercise sessions.    Initial Home Exercises Provided -- 12/22/23 12/22/23 12/22/23             Exercise Comments:   Exercise Comments     Row Name 11/27/23 1417 12/22/23 1643 01/17/24 1615 01/26/24 1131     Exercise Comments Pt's first day in the CRP2 program. Pt exercised today with no complaints. Off to a good start. Reviewed METs, goals and HERx. Pt is progressing well. Pt verbalized understanding of the home exercise Rx and was provied a copy. Reviewed METs and goals. Pt continues to progress well. Patient is achieving impresive MET levels with his exercise bout. Reviewed METs. Pt continues make exceptional progress on his MET levels.             Exercise Goals and Review:   Exercise Goals      Row Name 11/22/23 0745             Exercise Goals   Increase Physical Activity Yes       Intervention Provide advice, education, support and counseling about physical activity/exercise needs.;Develop an individualized exercise prescription for aerobic and resistive training based on initial evaluation findings, risk stratification, comorbidities and participant's personal goals.       Expected Outcomes Short Term: Attend rehab on a regular basis to increase amount of physical activity.;Long Term: Exercising regularly at least 3-5 days a week.;Long Term: Add in home exercise to make exercise part of routine and to increase amount of physical activity.       Increase Strength and Stamina Yes       Intervention Provide advice, education, support and counseling about physical activity/exercise needs.;Develop an individualized  exercise prescription for aerobic and resistive training based on initial evaluation findings, risk stratification, comorbidities and participant's personal goals.       Expected Outcomes Short Term: Increase workloads from initial exercise prescription for resistance, speed, and METs.;Short Term: Perform resistance training exercises routinely during rehab and add in resistance training at home;Long Term: Improve cardiorespiratory fitness, muscular endurance and strength as measured by increased METs and functional capacity ( )       Able to understand and use rate of perceived exertion (RPE) scale Yes       Intervention Provide education and explanation on how to use RPE scale       Expected Outcomes Short Term: Able to use RPE daily in rehab to express subjective intensity level;Long Term:  Able to use RPE to guide intensity level when exercising independently       Knowledge and understanding of Target Heart Rate Range (THRR) Yes       Intervention Provide education and explanation of THRR including how the numbers were predicted and where they are located for reference        Expected Outcomes Short Term: Able to state/look up THRR;Short Term: Able to use daily as guideline for intensity in rehab;Long Term: Able to use THRR to govern intensity when exercising independently       Understanding of Exercise Prescription Yes       Intervention Provide education, explanation, and written materials on patient's individual exercise prescription       Expected Outcomes Short Term: Able to explain program exercise prescription;Long Term: Able to explain home exercise prescription to exercise independently                Exercise Goals Re-Evaluation :  Exercise Goals Re-Evaluation     Row Name 11/27/23 1416 12/22/23 1640 01/17/24 1613         Exercise Goal Re-Evaluation   Exercise Goals Review Increase Physical Activity;Understanding of Exercise Prescription;Increase Strength and Stamina;Knowledge and understanding of Target Heart Rate Range (THRR);Able to understand and use rate of perceived exertion (RPE) scale Increase Physical Activity;Understanding of Exercise Prescription;Increase Strength and Stamina;Knowledge and understanding of Target Heart Rate Range (THRR);Able to understand and use rate of perceived exertion (RPE) scale Increase Physical Activity;Understanding of Exercise Prescription;Increase Strength and Stamina;Knowledge and understanding of Target Heart Rate Range (THRR);Able to understand and use rate of perceived exertion (RPE) scale     Comments Pt's first day in the CRP2 program. Pt understands the exercise Rx, RPE scale and THRR. Reviewed METs, goals and Home exercise Rx today. Pt is making progress on his goals of imrpoved strength and stamina. Pt is riding his bike at home and feels that he is almost back to pre surgery baseline. Peak MET sre 9.0. Pt has been riding the bike and walking at home 2-3x/week and will continue this. Reviewed METs, goals and Home exercise Rx today. Pt continues to make progress on his goals of imrpoved strength and  stamina. Pt is riding his bike at home and feels that he is almost back to pre surgery baseline. Peak MET are 10.5. Pt has been riding the bike and walking at home 2-3x/week and will continue this. Pt wants to start HIIT training on the Nustep, so we will begin that next session.     Expected Outcomes Will continue to montior patient and progress exercise workloads as tolerated. Will continue to montior patient and progress exercise workloads as tolerated. Will continue to montior patient and progress exercise workloads as  tolerated.              Discharge Exercise Prescription (Final Exercise Prescription Changes):  Exercise Prescription Changes - 01/26/24 1100       Response to Exercise   Blood Pressure (Admit) 102/64    Blood Pressure (Exit) 108/58    Heart Rate (Admit) 70 bpm    Heart Rate (Exercise) 143 bpm    Heart Rate (Exit) 85 bpm    Rating of Perceived Exertion (Exercise) 12    Symptoms None    Comments Reviewed METs    Duration Continue with 30 min of aerobic exercise without signs/symptoms of physical distress.    Intensity THRR unchanged      Progression   Progression Continue to progress workloads to maintain intensity without signs/symptoms of physical distress.    Average METs 9.1      Resistance Training   Training Prescription No    Weight 6 lbs    Reps 10-15    Time 10 Minutes      Interval Training   Interval Training No      T5 Nustep   Level 7    Minutes 15    METs 9.2      Rower   Level 5    Watts 142    Minutes 15    METs 8.98      Home Exercise Plan   Plans to continue exercise at Home (comment)    Frequency Add 3 additional days to program exercise sessions.    Initial Home Exercises Provided 12/22/23             Nutrition:  Target Goals: Understanding of nutrition guidelines, daily intake of sodium 1500mg , cholesterol 200mg , calories 30% from fat and 7% or less from saturated fats, daily to have 5 or more servings of fruits and  vegetables.  Biometrics:  Pre Biometrics - 11/22/23 0820       Pre Biometrics   Waist Circumference 37 inches    Hip Circumference 38 inches    Waist to Hip Ratio 0.97 %    Triceps Skinfold 11 mm    % Body Fat 22.7 %    Grip Strength 54 kg    Flexibility 11.25 in   knees bent. with knees straight/flat could not reach   Single Leg Stand 30 seconds              Nutrition Therapy Plan and Nutrition Goals:  Nutrition Therapy & Goals - 01/22/24 1038       Nutrition Therapy   Diet Heart Healthy Diet    Drug/Food Interactions Statins/Certain Fruits      Personal Nutrition Goals   Nutrition Goal Patient to identify strategies for reducing cardiovascular risk by attending the [facility] education and nutrition series weekly.   goal in progress.   Personal Goal #2 Patient to improve diet quality by using the plate method as a guide for meal planning to include lean protein/plant protein, fruits, vegetables, whole grains, nonfat dairy as part of a well-balanced diet.   goal in progress.   Comments Goals in progress. Jeffrey Liu continues to attend the [facility] education and nutrition series reguarly. 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 He is down 1.3# since starting with our program. He continues to adhere to a Pritikin diet. Patient will benefit from participation in intensive cardiac rehab for nutrition, exercise, and lifestyle modification.      Intervention Plan   Intervention Prescribe,  educate and counsel regarding individualized specific dietary modifications aiming towards targeted core components such as weight, hypertension, lipid management, diabetes, heart failure and other comorbidities.;Nutrition handout(s) given to patient.    Expected Outcomes Short Term Goal: Understand basic principles of dietary content, such as calories, fat, sodium, cholesterol and nutrients.;Long Term Goal: Adherence to prescribed  nutrition plan.             Nutrition Assessments:  MEDIFICTS Score Key: >=70 Need to make dietary changes  40-70 Heart Healthy Diet <= 40 Therapeutic Level Cholesterol Diet   Flowsheet Row INTENSIVE CARDIAC REHAB ORIENT from 11/22/2023 in [facility]  Picture Your Plate Total Score on Admission 82      Picture Your Plate Scores: <40 Unhealthy dietary pattern with much room for improvement. 41-50 Dietary pattern unlikely to meet recommendations for good health and room for improvement. 51-60 More healthful dietary pattern, with some room for improvement.  >60 Healthy dietary pattern, although there may be some specific behaviors that could be improved.    Nutrition Goals Re-Evaluation:  Nutrition Goals Re-Evaluation     Row Name 12/26/23 1047 01/22/24 1038           Goals   Current Weight 191 lb 12.8 oz (87 kg) 190 lb 7.6 oz (86.4 kg)      Comment unable to view lipid panel. CMP WNL. unable to view lipid panel, per documentation, lipids controlled.  CMP WNL.      Expected Outcome Goals in progress. Jeffrey Liu continues to attend the [facility] education and nutrition series reguarly. 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 He is down 4.4# since starting with our program. Patient will benefit from participation in intensive cardiac rehab for nutrition, exercise, and lifestyle modification. Goals in progress. Jeffrey Liu continues to attend the [facility] education and nutrition series reguarly. 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 He is down 1.3# since starting with our program. He continues to adhere to a Pritikin diet. He continues follow-up for atrial flutter. Patient will benefit from participation in intensive cardiac rehab for nutrition, exercise, and lifestyle modification.                Nutrition Goals Re-Evaluation:  Nutrition Goals Re-Evaluation     Row Name 12/26/23 1047 01/22/24 1038           Goals   Current Weight 191 lb 12.8 oz (87 kg) 190 lb 7.6 oz (86.4 kg)      Comment unable to view lipid panel. CMP WNL. unable to view lipid panel, per documentation, lipids controlled.  CMP WNL.      Expected Outcome Goals in progress. Jeffrey Liu continues to attend the [facility] education and nutrition series reguarly. 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 He is down 4.4# since starting with our program. Patient will benefit from participation in intensive cardiac rehab for nutrition, exercise, and lifestyle modification. Goals in progress. Jeffrey Liu continues to attend the [facility] education and nutrition series reguarly. 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 He is down 1.3# since starting with our program. He continues to adhere to a Pritikin diet. He continues follow-up for atrial flutter. Patient will benefit from participation in intensive cardiac rehab for nutrition, exercise, and lifestyle modification.  Nutrition Goals Discharge (Final Nutrition Goals Re-Evaluation):  Nutrition Goals Re-Evaluation - 01/22/24 1038       Goals   Current Weight 190 lb 7.6 oz (86.4 kg)    Comment unable to view lipid panel, per documentation, lipids controlled.  CMP WNL.    Expected Outcome Goals in progress. Jeffrey Liu continues to attend the [facility] education and nutrition series reguarly. 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 He is down 1.3# since starting with our program. He continues to adhere to a Pritikin diet. He continues follow-up for atrial flutter. Patient will benefit from participation in intensive cardiac rehab for nutrition, exercise, and lifestyle modification.              Psychosocial: Target Goals: Acknowledge presence or absence of significant depression and/or stress, maximize coping skills, provide positive support system. Participant is able to verbalize types and ability to use techniques and skills needed for reducing stress and depression.  Initial Review & Psychosocial Screening:  Initial Psych Review & Screening - 11/22/23 0832       Initial Review   Current issues with None Identified      Family Dynamics   Good Support System? Yes   Cordai has his wife for support     Barriers   Psychosocial barriers to participate in program There are no identifiable barriers or psychosocial needs.      Screening Interventions   Interventions Encouraged to exercise;Provide feedback about the scores to participant    Expected Outcomes Short Term goal: Identification and review with participant of any Quality of Life or Depression concerns found by scoring the questionnaire.;Long Term goal: The participant improves quality of Life and PHQ9 Scores as seen by post scores and/or verbalization of changes             Quality of Life Scores:  Quality of Life - 11/22/23 0838       Quality of Life   Select Quality of Life      Quality of Life Scores   Health/Function Pre 28.7 %    Socioeconomic Pre 30 %    Psych/Spiritual Pre 27.14 %    Family Pre 30 %    GLOBAL Pre 28.84 %            Scores of 19 and below usually indicate a poorer quality of life in these areas.  A difference of  2-3 points is a clinically meaningful difference.  A difference of 2-3 points in the total score of the Quality of Life Index has been associated with significant improvement in overall quality of life, self-image, physical symptoms, and general health in studies assessing change in quality of life.  PHQ-9: Review Flowsheet       11/22/2023  Depression screen PHQ 2/9  Decreased Interest 0  Down, Depressed, Hopeless 0  PHQ - 2 Score 0  Altered  sleeping 2  Tired, decreased energy 0  Change in appetite 0  Feeling bad or failure about yourself  0  Trouble concentrating 0  Moving slowly or fidgety/restless 0  Suicidal thoughts 0  PHQ-9 Score 2  Difficult doing work/chores Not difficult at all   Interpretation of Total Score  Total Score Depression Severity:  1-4 = Minimal depression, 5-9 = Mild depression, 10-14 = Moderate depression, 15-19 = Moderately severe depression, 20-27 = Severe depression   Psychosocial Evaluation and Intervention:   Psychosocial Re-Evaluation:  Psychosocial Re-Evaluation     Row Name  11/27/23 1603 01/02/24 0918 01/29/24 4098         Psychosocial Re-Evaluation   Current issues with Current Sleep Concerns None Identified None Identified     Comments Jeffrey Liu says he has difficulty going back to sleep when he wakes up. Jeffrey Liu works on a crossword puzzle to help with this. Bastian has not voiced any increased concerns or stressors during exercise at cardiac rehab. Ashok has not voiced any increased concerns or stressors during exercise at cardiac rehab.     Expected Outcomes Davi will have improved sleep habits upon completion of cardiac rehab -- --     Interventions Relaxation education;Stress management education;Encouraged to attend Cardiac Rehabilitation for the exercise Relaxation education;Stress management education;Encouraged to attend Cardiac Rehabilitation for the exercise Relaxation education;Stress management education;Encouraged to attend Cardiac Rehabilitation for the exercise     Continue Psychosocial Services  No Follow up required No Follow up required No Follow up required              Psychosocial Discharge (Final Psychosocial Re-Evaluation):  Psychosocial Re-Evaluation - 01/29/24 1191       Psychosocial Re-Evaluation   Current issues with None Identified    Comments Jeffrey Liu has not voiced any increased concerns or stressors during exercise at cardiac rehab.    Interventions  Relaxation education;Stress management education;Encouraged to attend Cardiac Rehabilitation for the exercise    Continue Psychosocial Services  No Follow up required             Vocational Rehabilitation: Provide vocational rehab assistance to qualifying candidates.   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 Participant will state understanding/return demonstration of topics presented.    Education     Row Name 11/27/23 0800     Education   Cardiac Education Topics [facility]   Select Workshops     Workshops   Educator Nurse   Select Exercise   Exercise Workshop Managing Heart Disease: Your Path to a Healthier Heart   Instruction Review Code 1- Verbalizes Understanding   Class Start Time [phone]   Class Stop Time 0846   Class Time Calculation (min) 35 min    Row Name 11/29/23 1000     Education   Cardiac Education Topics Pritikin   Secondary school teacher School   Educator Dietitian;Respiratory Therapist   Weekly Topic Simple Sides and Sauces   Instruction Review Code 1- Verbalizes Understanding   Class Start Time 0815   Class Stop Time 0847   Class Time Calculation (min) 32 min    Row Name 12/01/23 0800     Education   Cardiac Education Topics Pritikin   Select Core Videos     Core Videos   Educator Exercise Physiologist   Select General Education   General Education Hypertension and Heart Disease   Instruction Review Code 1- Verbalizes Understanding   Class Start Time 0815   Class Stop Time 0849   Class Time Calculation (min) 34 min    Row Name 12/04/23 0800     Education   Cardiac Education Topics  Pritikin   [employer]  Educator Exercise Clinical cytogeneticist Psychosocial   Psychosocial Workshop From Head to Heart: The Power of a Healthy Outlook   Instruction Review Code 1- Verbalizes Understanding   Class Start Time 0815   Class Stop Time 0903   Class Time Calculation (min) 48 min    Row Name 12/06/23 0900     Education   Cardiac Education Topics Pritikin   Secondary school teacher School   Educator Respiratory Therapist;Nurse   Weekly Topic Powerhouse Plant-Based Proteins   Instruction Review Code 1- Verbalizes Understanding   Class Start Time 0815   Class Stop Time 0847   Class Time Calculation (min) 32 min    Row Name 12/11/23 0900     Education   Cardiac Education Topics Pritikin   Select Workshops     Workshops   Educator Exercise Physiologist   Select Exercise   Exercise Workshop Location manager and Fall Prevention   Instruction Review Code 1- Verbalizes Understanding   Class Start Time [id number]   Class Stop Time 0848   Class Time Calculation (min) 34 min    Row Name 12/18/23 0900     Education   Cardiac Education Topics Pritikin   Nurse, children's   Educator Exercise Physiologist   Select Psychosocial   Psychosocial Healthy Minds, Bodies, Hearts   Instruction Review Code 1- Verbalizes Understanding   Class Start Time 0813   Class Stop Time 0847   Class Time Calculation (min) 34 min    Row Name 12/20/23 0900     Education   Cardiac Education Topics Pritikin   Secondary school teacher School   Educator Respiratory Therapist;Nurse   Weekly Topic Fast and Healthy Breakfasts   Instruction Review Code 1- Verbalizes Understanding   Class Start Time [phone]   Class Stop Time 0844   Class Time Calculation (min) 32 min    Row Name 12/22/23 0900     Education   Cardiac Education Topics Pritikin   Select Core Videos     Core Videos   Educator Dietitian   Select Nutrition   Nutrition  Other  Label Reading   Instruction Review Code 1- Verbalizes Understanding   Class Start Time 0815   Class Stop Time 0853   Class Time Calculation (min) 38 min    Row Name 12/25/23 0900     Education   Cardiac Education Topics Pritikin   Select Core Videos     Core Videos   Educator Nurse   Select Nutrition   Nutrition Becoming a Pritikin Chef   Instruction Review Code 1- Verbalizes Understanding   Class Start Time 0815   Class Stop Time 0856   Class Time Calculation (min) 41 min    Row Name 12/27/23 0900     Education   Cardiac Education Topics Pritikin   Secondary school teacher School   Educator Dietitian;Respiratory Therapist   Weekly Topic Personalizing Your Pritikin Plate   Instruction Review Code 1- Verbalizes Understanding   Class Start Time 0815   Class Stop Time 0848   Class Time Calculation (min) 33 min    Row Name 01/01/24 1000     Education   Cardiac Education Topics Pritikin   Geographical information systems officer Psychosocial   Psychosocial Workshop Recognizing and Reducing Stress   Instruction Review Code 1- [employer]  Understanding    Row Name 01/01/24 1600     Education   Cardiac Education Topics Pritikin   Select Workshops     Workshops   Educator Exercise Physiologist   Select Psychosocial   Psychosocial Workshop Recognizing and Reducing Stress   Instruction Review Code 1- Verbalizes Understanding   Class Start Time 0815   Class Stop Time 0858   Class Time Calculation (min) 43 min    Row Name 01/03/24 1000     Education   Cardiac Education Topics Pritikin   Secondary school teacher School   Educator Dietitian   Weekly Topic Delicious Desserts   Instruction Review Code 1- Verbalizes Understanding   Class Start Time 0815   Class Stop Time 0900   Class Time Calculation (min) 45 min    Row Name 01/05/24 0900     Education   Cardiac Education Topics Pritikin   Select  Core Videos     Core Videos   Educator Dietitian   Nutrition Calorie Density   Instruction Review Code 1- Verbalizes Understanding   Class Start Time 0815   Class Stop Time 0850   Class Time Calculation (min) 35 min    Row Name 01/08/24 0900     Education   Cardiac Education Topics Pritikin   Select Workshops     Workshops   Educator Exercise Physiologist   Select Exercise   Exercise Workshop Exercise Basics: Building Your Action Plan   Instruction Review Code 1- Verbalizes Understanding   Class Start Time 0810   Class Stop Time 0853   Class Time Calculation (min) 43 min    Row Name 01/10/24 0900     Education   Cardiac Education Topics Pritikin   Orthoptist   Educator Dietitian   Weekly Topic Efficiency Cooking - Meals in a Snap   Instruction Review Code 1- Verbalizes Understanding   Class Start Time 0815   Class Stop Time 0850   Class Time Calculation (min) 35 min    Row Name 01/17/24 0800     Education   Cardiac Education Topics Pritikin   Orthoptist   Educator Dietitian   Weekly Topic One-Pot Wonders   Instruction Review Code 1- Verbalizes Understanding   Class Start Time 0815   Class Stop Time 0900   Class Time Calculation (min) 45 min    Row Name 01/19/24 0800     Education   Cardiac Education Topics Pritikin   Psychologist, forensic General Education   General Education Hypertension and Heart Disease   Instruction Review Code 1- Verbalizes Understanding   Class Start Time 0813   Class Stop Time 0844   Class Time Calculation (min) 31 min    Row Name 01/22/24 0900     Education   Cardiac Education Topics Pritikin   Select Workshops     Workshops   Educator Exercise Physiologist   Select Psychosocial   Psychosocial Workshop Focused Goals, Sustainable Changes   Instruction Review Code 1- Verbalizes Understanding   Class  Start Time 0813   Class Stop Time 0846   Class Time Calculation (min) 33 min    Row Name 01/26/24 0900     Education   Cardiac Education Topics Pritikin   Nurse, children's  Dietitian   Select Nutrition   Nutrition Dining Out - Part 1   Instruction Review Code 1- Verbalizes Understanding   Class Start Time 0815   Class Stop Time 0848   Class Time Calculation (min) 33 min    Row Name 01/29/24 0900     Education   Cardiac Education Topics Pritikin   Select Core Videos     Core Videos   Educator Exercise Physiologist   Select Exercise Education   Exercise Education Biomechanial Limitations   Instruction Review Code 1- Verbalizes Understanding   Class Start Time 0808   Class Stop Time 0841   Class Time Calculation (min) 33 min            Core Videos: Exercise    Move It!  Clinical staff conducted group or individual video education with verbal and written material and guidebook.  Patient learns the recommended Pritikin exercise program. Exercise with the goal of living a long, healthy life. Some of the health benefits of exercise include controlled diabetes, healthier blood pressure levels, improved cholesterol levels, improved heart and lung capacity, improved sleep, and better body composition. Everyone should speak with their doctor before starting or changing an exercise routine.  Biomechanical Limitations Clinical staff conducted group or individual video education with verbal and written material and guidebook.  Patient learns how biomechanical limitations can impact exercise and how we can mitigate and possibly overcome limitations to have an impactful and balanced exercise routine.  Body Composition Clinical staff conducted group or individual video education with verbal and written material and guidebook.  Patient learns that body composition (ratio of muscle mass to fat mass) is a key component to assessing overall fitness, rather  than body weight alone. Increased fat mass, especially visceral belly fat, can put [place] at increased risk for metabolic syndrome, type 2 diabetes, heart disease, and even death. It is recommended to combine diet and exercise (cardiovascular and resistance training) to improve your body composition. Seek guidance from your physician and exercise physiologist before implementing an exercise routine.  Exercise Action Plan Clinical staff conducted group or individual video education with verbal and written material and guidebook.  Patient learns the recommended strategies to achieve and enjoy long-term exercise adherence, including variety, self-motivation, self-efficacy, and positive decision making. Benefits of exercise include fitness, good health, weight management, more energy, better sleep, less stress, and overall well-being.  Medical   Heart Disease Risk Reduction Clinical staff conducted group or individual video education with verbal and written material and guidebook.  Patient learns our heart is our most vital organ as it circulates oxygen, nutrients, white blood cells, and hormones throughout the entire body, and carries waste away. Data supports a plant-based eating plan like the Pritikin Program for its effectiveness in slowing progression of and reversing heart disease. The video provides a number of recommendations to address heart disease.   Metabolic Syndrome and Belly Fat  Clinical staff conducted group or individual video education with verbal and written material and guidebook.  Patient learns what metabolic syndrome is, how it leads to heart disease, and how one can reverse it and keep it from coming back. You have metabolic syndrome if you have 3 of the following 5 criteria: abdominal obesity, high blood pressure, high triglycerides, low HDL cholesterol, and high blood sugar.  Hypertension and Heart Disease Clinical staff conducted group or individual video education with verbal and  written material and guidebook.  Patient learns that high blood pressure, or hypertension, is very common  in the [place]. Hypertension is largely due to excessive salt intake, but other important risk factors include being overweight, physical inactivity, drinking too much alcohol, smoking, and not eating enough potassium from fruits and vegetables. High blood pressure is a leading risk factor for heart attack, stroke, congestive heart failure, dementia, kidney failure, and premature death. Long-term effects of excessive salt intake include stiffening of the arteries and thickening of heart muscle and organ damage. Recommendations include ways to reduce hypertension and the risk of heart disease.  Diseases of Our Time - Focusing on Diabetes Clinical staff conducted group or individual video education with verbal and written material and guidebook.  Patient learns why the best way to stop diseases of our time is prevention, through food and other lifestyle changes. Medicine (such as prescription pills and surgeries) is often only a Band-Aid on the problem, not a long-term solution. Most common diseases of our time include obesity, type 2 diabetes, hypertension, heart disease, and cancer. The Pritikin Program is recommended and has been proven to help reduce, reverse, and/or prevent the damaging effects of metabolic syndrome.  Nutrition   Overview of the Pritikin Eating Plan  Clinical staff conducted group or individual video education with verbal and written material and guidebook.  Patient learns about the Pritikin Eating Plan for disease risk reduction. The Pritikin Eating Plan emphasizes a wide variety of unrefined, minimally-processed carbohydrates, like fruits, vegetables, whole grains, and legumes. Go, Caution, and Stop food choices are explained. Plant-based and lean animal proteins are emphasized. Rationale provided for low sodium intake for blood pressure control, low added sugars for blood  sugar stabilization, and low added fats and oils for coronary artery disease risk reduction and weight management.  Calorie Density  Clinical staff conducted group or individual video education with verbal and written material and guidebook.  Patient learns about calorie density and how it impacts the Pritikin Eating Plan. Knowing the characteristics of the food you choose will help you decide whether those foods will lead to weight gain or weight loss, and whether you want to consume more or less of them. Weight loss is usually a side effect of the Pritikin Eating Plan because of its focus on low calorie-dense foods.  Label Reading  Clinical staff conducted group or individual video education with verbal and written material and guidebook.  Patient learns about the Pritikin recommended label reading guidelines and corresponding recommendations regarding calorie density, added sugars, sodium content, and whole grains.  Dining Out - Part 1  Clinical staff conducted group or individual video education with verbal and written material and guidebook.  Patient learns that restaurant meals can be sabotaging because they can be so high in calories, fat, sodium, and/or sugar. Patient learns recommended strategies on how to positively address this and avoid unhealthy pitfalls.  Facts on Fats  Clinical staff conducted group or individual video education with verbal and written material and guidebook.  Patient learns that lifestyle modifications can be just as effective, if not more so, as many medications for lowering your risk of heart disease. A Pritikin lifestyle can help to reduce your risk of inflammation and atherosclerosis (cholesterol build-up, or plaque, in the artery walls). Lifestyle interventions such as dietary choices and physical activity address the cause of atherosclerosis. A review of the types of fats and their impact on blood cholesterol levels, along with dietary recommendations to reduce  fat intake is also included.  Nutrition Action Plan  Clinical staff conducted group or individual video education with  verbal and written material and guidebook.  Patient learns how to incorporate Pritikin recommendations into their lifestyle. Recommendations include planning and keeping personal health goals in mind as an important part of their success.  Healthy Mind-Set    Healthy Minds, Bodies, Hearts  Clinical staff conducted group or individual video education with verbal and written material and guidebook.  Patient learns how to identify when they are stressed. Video will discuss the impact of that stress, as well as the many benefits of stress management. Patient will also be introduced to stress management techniques. The way we think, act, and feel has an impact on our hearts.  How Our Thoughts Can Heal Our Hearts  Clinical staff conducted group or individual video education with verbal and written material and guidebook.  Patient learns that negative thoughts can cause depression and anxiety. This can result in negative lifestyle behavior and serious health problems. Cognitive behavioral therapy is an effective method to help control our thoughts in order to change and improve our emotional outlook.  Additional Videos:  Exercise    Improving Performance  Clinical staff conducted group or individual video education with verbal and written material and guidebook.  Patient learns to use a non-linear approach by alternating intensity levels and lengths of time spent exercising to help burn more calories and lose more body fat. Cardiovascular exercise helps improve heart health, metabolism, hormonal balance, blood sugar control, and recovery from fatigue. Resistance training improves strength, endurance, balance, coordination, reaction time, metabolism, and muscle mass. Flexibility exercise improves circulation, posture, and balance. Seek guidance from your physician and exercise  physiologist before implementing an exercise routine and learn your capabilities and proper form for all exercise.  Introduction to Yoga  Clinical staff conducted group or individual video education with verbal and written material and guidebook.  Patient learns about yoga, a discipline of the coming together of mind, breath, and body. The benefits of yoga include improved flexibility, improved range of motion, better posture and core strength, increased lung function, weight loss, and positive self-image. Yoga's heart health benefits include lowered blood pressure, healthier heart rate, decreased cholesterol and triglyceride levels, improved immune function, and reduced stress. Seek guidance from your physician and exercise physiologist before implementing an exercise routine and learn your capabilities and proper form for all exercise.  Medical   Aging: Enhancing Your Quality of Life  Clinical staff conducted group or individual video education with verbal and written material and guidebook.  Patient learns key strategies and recommendations to stay in good physical health and enhance quality of life, such as prevention strategies, having an advocate, securing a Health Care Proxy and Power of Attorney, and keeping a list of medications and system for tracking them. It also discusses how to avoid risk for bone loss.  Biology of Weight Control  Clinical staff conducted group or individual video education with verbal and written material and guidebook.  Patient learns that weight gain occurs because we consume more calories than we burn (eating more, moving less). Even if your body weight is normal, you may have higher ratios of fat compared to muscle mass. Too much body fat puts you at increased risk for cardiovascular disease, heart attack, stroke, type 2 diabetes, and obesity-related cancers. In addition to exercise, following the Pritikin Eating Plan can help reduce your risk.  Decoding Lab Results   Clinical staff conducted group or individual video education with verbal and written material and guidebook.  Patient learns that lab test reflects one measurement whose  values change over time and are influenced by many factors, including medication, stress, sleep, exercise, food, hydration, pre-existing medical conditions, and more. It is recommended to use the knowledge from this video to become more involved with your lab results and evaluate your numbers to speak with your doctor.   Diseases of Our Time - Overview  Clinical staff conducted group or individual video education with verbal and written material and guidebook.  Patient learns that according to the CDC, 50% to 70% of chronic diseases (such as obesity, type 2 diabetes, elevated lipids, hypertension, and heart disease) are avoidable through lifestyle improvements including healthier food choices, listening to satiety cues, and increased physical activity.  Sleep Disorders Clinical staff conducted group or individual video education with verbal and written material and guidebook.  Patient learns how good quality and duration of sleep are important to overall health and well-being. Patient also learns about sleep disorders and how they impact health along with recommendations to address them, including discussing with a physician.  Nutrition  Dining Out - Part 2 Clinical staff conducted group or individual video education with verbal and written material and guidebook.  Patient learns how to plan ahead and communicate in order to maximize their dining experience in a healthy and nutritious manner. Included are recommended food choices based on the type of restaurant the patient is visiting.   Fueling a Banker conducted group or individual video education with verbal and written material and guidebook.  There is a strong connection between our food choices and our health. Diseases like obesity and type 2 diabetes  are very prevalent and are in large-part due to lifestyle choices. The Pritikin Eating Plan provides plenty of food and hunger-curbing satisfaction. It is easy to follow, affordable, and helps reduce health risks.  Menu Workshop  Clinical staff conducted group or individual video education with verbal and written material and guidebook.  Patient learns that restaurant meals can sabotage health goals because they are often packed with calories, fat, sodium, and sugar. Recommendations include strategies to plan ahead and to communicate with the manager, chef, or server to help order a healthier meal.  Planning Your Eating Strategy  Clinical staff conducted group or individual video education with verbal and written material and guidebook.  Patient learns about the Pritikin Eating Plan and its benefit of reducing the risk of disease. The Pritikin Eating Plan does not focus on calories. Instead, it emphasizes high-quality, nutrient-rich foods. By knowing the characteristics of the foods, we choose, we can determine their calorie density and make informed decisions.  Targeting Your Nutrition Priorities  Clinical staff conducted group or individual video education with verbal and written material and guidebook.  Patient learns that lifestyle habits have a tremendous impact on disease risk and progression. This video provides eating and physical activity recommendations based on your personal health goals, such as reducing LDL cholesterol, losing weight, preventing or controlling type 2 diabetes, and reducing high blood pressure.  Vitamins and Minerals  Clinical staff conducted group or individual video education with verbal and written material and guidebook.  Patient learns different ways to obtain key vitamins and minerals, including through a recommended healthy diet. It is important to discuss all supplements you take with your doctor.   Healthy Mind-Set    Smoking Cessation  Clinical staff  conducted group or individual video education with verbal and written material and guidebook.  Patient learns that cigarette smoking and tobacco addiction pose a serious health risk which  affects millions of people. Stopping smoking will significantly reduce the risk of heart disease, lung disease, and many forms of cancer. Recommended strategies for quitting are covered, including working with your doctor to develop a successful plan.  Culinary   Becoming a Set designer conducted group or individual video education with verbal and written material and guidebook.  Patient learns that cooking at home can be healthy, cost-effective, quick, and puts them in control. Keys to cooking healthy recipes will include looking at your recipe, assessing your equipment needs, planning ahead, making it simple, choosing cost-effective seasonal ingredients, and limiting the use of added fats, salts, and sugars.  Cooking - Breakfast and Snacks  Clinical staff conducted group or individual video education with verbal and written material and guidebook.  Patient learns how important breakfast is to satiety and nutrition through the entire day. Recommendations include key foods to eat during breakfast to help stabilize blood sugar levels and to prevent overeating at meals later in the day. Planning ahead is also a key component.  Cooking - Educational psychologist conducted group or individual video education with verbal and written material and guidebook.  Patient learns eating strategies to improve overall health, including an approach to cook more at home. Recommendations include thinking of animal protein as a side on your plate rather than center stage and focusing instead on lower calorie dense options like vegetables, fruits, whole grains, and plant-based proteins, such as beans. Making sauces in large quantities to freeze for later and leaving the skin on your vegetables are also  recommended to maximize your experience.  Cooking - Healthy Salads and Dressing Clinical staff conducted group or individual video education with verbal and written material and guidebook.  Patient learns that vegetables, fruits, whole grains, and legumes are the foundations of the Pritikin Eating Plan. Recommendations include how to incorporate each of these in flavorful and healthy salads, and how to create homemade salad dressings. Proper handling of ingredients is also covered. Cooking - Soups and [employer] - Soups and Desserts Clinical staff conducted group or individual video education with verbal and written material and guidebook.  Patient learns that Pritikin soups and desserts make for easy, nutritious, and delicious snacks and meal components that are low in sodium, fat, sugar, and calorie density, while high in vitamins, minerals, and filling fiber. Recommendations include simple and healthy ideas for soups and desserts.   Overview     The Pritikin Solution Program Overview Clinical staff conducted group or individual video education with verbal and written material and guidebook.  Patient learns that the results of the Pritikin Program have been documented in more than 100 articles published in peer-reviewed journals, and the benefits include reducing risk factors for (and, in some cases, even reversing) high cholesterol, high blood pressure, type 2 diabetes, obesity, and more! An overview of the three key pillars of the Pritikin Program will be covered: eating well, doing regular exercise, and having a healthy mind-set.  WORKSHOPS  Exercise: Exercise Basics: Building Your Action Plan Clinical staff led group instruction and group discussion with PowerPoint presentation and patient guidebook. To enhance the learning environment the use of posters, models and videos may be added. At the conclusion of this workshop, patients will comprehend the difference between physical  activity and exercise, as well as the benefits of incorporating both, into their routine. Patients will understand the FITT (Frequency, Intensity, Time, and Type) principle and how to use it to build  an exercise action plan. In addition, safety concerns and other considerations for exercise and cardiac rehab will be addressed by the presenter. The purpose of this lesson is to promote a comprehensive and effective weekly exercise routine in order to improve patients' overall level of fitness.   Managing Heart Disease: Your Path to a Healthier Heart Clinical staff led group instruction and group discussion with PowerPoint presentation and patient guidebook. To enhance the learning environment the use of posters, models and videos may be added.At the conclusion of this workshop, patients will understand the anatomy and physiology of the heart. Additionally, they will understand how Pritikin's three pillars impact the risk factors, the progression, and the management of heart disease.  The purpose of this lesson is to provide a high-level overview of the heart, heart disease, and how the Pritikin lifestyle positively impacts risk factors.  Exercise Biomechanics Clinical staff led group instruction and group discussion with PowerPoint presentation and patient guidebook. To enhance the learning environment the use of posters, models and videos may be added. Patients will learn how the structural parts of their bodies function and how these functions impact their daily activities, movement, and exercise. Patients will learn how to promote a neutral spine, learn how to manage pain, and identify ways to improve their physical movement in order to promote healthy living. The purpose of this lesson is to expose patients to common physical limitations that impact physical activity. Participants will learn practical ways to adapt and manage aches and pains, and to minimize their effect on regular exercise.  Patients will learn how to maintain good posture while sitting, walking, and lifting.  Balance Training and Fall Prevention  Clinical staff led group instruction and group discussion with PowerPoint presentation and patient guidebook. To enhance the learning environment the use of posters, models and videos may be added. At the conclusion of this workshop, patients will understand the importance of their sensorimotor skills (vision, proprioception, and the vestibular system) in maintaining their ability to balance as they age. Patients will apply a variety of balancing exercises that are appropriate for their current level of function. Patients will understand the common causes for poor balance, possible solutions to these problems, and ways to modify their physical environment in order to minimize their fall risk. The purpose of this lesson is to teach patients about the importance of maintaining balance as they age and ways to minimize their risk of falling.  WORKSHOPS   Nutrition:  Fueling a Ship broker led group instruction and group discussion with PowerPoint presentation and patient guidebook. To enhance the learning environment the use of posters, models and videos may be added. Patients will review the foundational principles of the Pritikin Eating Plan and understand what constitutes a serving size in each of the food groups. Patients will also learn Pritikin-friendly foods that are better choices when away from home and review make-ahead meal and snack options. Calorie density will be reviewed and applied to three nutrition priorities: weight maintenance, weight loss, and weight gain. The purpose of this lesson is to reinforce (in a group setting) the key concepts around what patients are recommended to eat and how to apply these guidelines when away from home by planning and selecting Pritikin-friendly options. Patients will understand how calorie density may be adjusted for  different weight management goals.  Mindful Eating  Clinical staff led group instruction and group discussion with PowerPoint presentation and patient guidebook. To enhance the learning environment the use of posters, models  and videos may be added. Patients will briefly review the concepts of the Pritikin Eating Plan and the importance of low-calorie dense foods. The concept of mindful eating will be introduced as well as the importance of paying attention to internal hunger signals. Triggers for non-hunger eating and techniques for dealing with triggers will be explored. The purpose of this lesson is to provide patients with the opportunity to review the basic principles of the Pritikin Eating Plan, discuss the value of eating mindfully and how to measure internal cues of hunger and fullness using the Hunger Scale. Patients will also discuss reasons for non-hunger eating and learn strategies to use for controlling emotional eating.  Targeting Your Nutrition Priorities Clinical staff led group instruction and group discussion with PowerPoint presentation and patient guidebook. To enhance the learning environment the use of posters, models and videos may be added. Patients will learn how to determine their genetic susceptibility to disease by reviewing their family history. Patients will gain insight into the importance of diet as part of an overall healthy lifestyle in mitigating the impact of genetics and other environmental insults. The purpose of this lesson is to provide patients with the opportunity to assess their personal nutrition priorities by looking at their family history, their own health history and current risk factors. Patients will also be able to discuss ways of prioritizing and modifying the Pritikin Eating Plan for their highest risk areas  Menu  Clinical staff led group instruction and group discussion with PowerPoint presentation and patient guidebook. To enhance the learning  environment the use of posters, models and videos may be added. Using menus brought in from [employer], or printed from [employer], patients will apply the Pritikin dining out guidelines that were presented in the [employer] video. Patients will also be able to practice these guidelines in a variety of provided scenarios. The purpose of this lesson is to provide patients with the opportunity to practice hands-on learning of the Pritikin Dining Out guidelines with actual menus and practice scenarios.  Label Reading Clinical staff led group instruction and group discussion with PowerPoint presentation and patient guidebook. To enhance the learning environment the use of posters, models and videos may be added. Patients will review and discuss the Pritikin label reading guidelines presented in Pritikin's Label Reading Educational series video. Using fool labels brought in from local grocery stores and markets, patients will apply the label reading guidelines and determine if the packaged food meet the Pritikin guidelines. The purpose of this lesson is to provide patients with the opportunity to review, discuss, and practice hands-on learning of the Pritikin Label Reading guidelines with actual packaged food labels. Cooking School  Pritikin's [employer] are designed to teach patients ways to prepare quick, simple, and affordable recipes at home. The importance of nutrition's role in chronic disease risk reduction is reflected in its emphasis in the overall Pritikin program. By learning how to prepare essential core Pritikin Eating Plan recipes, patients will increase control over what they eat; be able to customize the flavor of foods without the use of added salt, sugar, or fat; and improve the quality of the food they consume. By learning a set of core recipes which are easily assembled, quickly prepared, and affordable, patients are more likely to prepare more healthy  foods at home. These workshops focus on convenient breakfasts, simple entres, side dishes, and desserts which can be prepared with minimal effort and are consistent with nutrition recommendations for cardiovascular risk  reduction. Cooking [employer] are taught by a Armed forces logistics/support/administrative officer (RD) who has been trained by the [employer]. The chef or RD has a clear understanding of the importance of minimizing - if not completely eliminating - added fat, sugar, and sodium in recipes. Throughout the series of Cooking School Workshop sessions, patients will learn about healthy ingredients and efficient methods of cooking to build confidence in their capability to prepare    Cooking School weekly topics:  Adding Flavor- Sodium-Free  Fast and Healthy Breakfasts  Powerhouse Plant-Based Proteins  Satisfying Salads and Dressings  Simple Sides and Sauces  International Cuisine-Spotlight on the [employer] Zones  Delicious Desserts  Savory Soups  [employer] - Meals in a Astronomer Appetizers and Snacks  Comforting Weekend Breakfasts  One-Pot Wonders   Fast Evening Meals  Landscape architect Your Pritikin Plate  WORKSHOPS   Healthy Mindset (Psychosocial):  Focused Goals, Sustainable Changes Clinical staff led group instruction and group discussion with PowerPoint presentation and patient guidebook. To enhance the learning environment the use of posters, models and videos may be added. Patients will be able to apply effective goal setting strategies to establish at least one personal goal, and then take consistent, meaningful action toward that goal. They will learn to identify common barriers to achieving personal goals and develop strategies to overcome them. Patients will also gain an understanding of how our mind-set can impact our ability to achieve goals and the importance of cultivating a positive and growth-oriented mind-set. The purpose of this lesson is to  provide patients with a deeper understanding of how to set and achieve personal goals, as well as the tools and strategies needed to overcome common obstacles which may arise along the way.  From Head to Heart: The Power of a Healthy Outlook  Clinical staff led group instruction and group discussion with PowerPoint presentation and patient guidebook. To enhance the learning environment the use of posters, models and videos may be added. Patients will be able to recognize and describe the impact of emotions and mood on physical health. They will discover the importance of self-care and explore self-care practices which may work for them. Patients will also learn how to utilize the 4 C's to cultivate a healthier outlook and better manage stress and challenges. The purpose of this lesson is to demonstrate to patients how a healthy outlook is an essential part of maintaining good health, especially as they continue their cardiac rehab journey.  Healthy Sleep for a Healthy Heart Clinical staff led group instruction and group discussion with PowerPoint presentation and patient guidebook. To enhance the learning environment the use of posters, models and videos may be added. At the conclusion of this workshop, patients will be able to demonstrate knowledge of the importance of sleep to overall health, well-being, and quality of life. They will understand the symptoms of, and treatments for, common sleep disorders. Patients will also be able to identify daytime and nighttime behaviors which impact sleep, and they will be able to apply these tools to help manage sleep-related challenges. The purpose of this lesson is to provide patients with a general overview of sleep and outline the importance of quality sleep. Patients will learn about a few of the most common sleep disorders. Patients will also be introduced to the concept of "sleep hygiene," and discover ways to self-manage certain sleeping problems through  simple daily behavior changes. Finally, the workshop will motivate patients by clarifying the links between  quality sleep and their goals of heart-healthy living.   Recognizing and Reducing Stress Clinical staff led group instruction and group discussion with PowerPoint presentation and patient guidebook. To enhance the learning environment the use of posters, models and videos may be added. At the conclusion of this workshop, patients will be able to understand the types of stress reactions, differentiate between acute and chronic stress, and recognize the impact that chronic stress has on their health. They will also be able to apply different coping mechanisms, such as reframing negative self-talk. Patients will have the opportunity to practice a variety of stress management techniques, such as deep abdominal breathing, progressive muscle relaxation, and/or guided imagery.  The purpose of this lesson is to educate patients on the role of stress in their lives and to provide healthy techniques for coping with it.  Learning Barriers/Preferences:  Learning Barriers/Preferences - 11/22/23 1610       Learning Barriers/Preferences   Learning Barriers Sight   wears glasses   Learning Preferences Audio;Computer/Internet;Group Instruction;Individual Instruction;Skilled Demonstration;Pictoral;Verbal Instruction;Video;Written Material             Education Topics:  Knowledge Questionnaire Score:  Knowledge Questionnaire Score - 11/22/23 0834       Knowledge Questionnaire Score   Pre Score 24/24             Core Components/Risk Factors/Patient Goals at Admission:  Personal Goals and Risk Factors at Admission - 11/22/23 0831       Core Components/Risk Factors/Patient Goals on Admission    Weight Management Yes;Weight Maintenance    Intervention Weight Management: Develop a combined nutrition and exercise program designed to reach desired caloric intake, while maintaining appropriate  intake of nutrient and fiber, sodium and fats, and appropriate energy expenditure required for the weight goal.;Weight Management: Provide education and appropriate resources to help participant work on and attain dietary goals.    Expected Outcomes Short Term: Continue to assess and modify interventions until short term weight is achieved;Long Term: Adherence to nutrition and physical activity/exercise program aimed toward attainment of established weight goal;Weight Maintenance: Understanding of the daily nutrition guidelines, which includes 25-35% calories from fat, 7% or less cal from saturated fats, less than 200mg  cholesterol, less than 1.5gm of sodium, & 5 or more servings of fruits and vegetables daily;Understanding recommendations for meals to include 15-35% energy as protein, 25-35% energy from fat, 35-60% energy from carbohydrates, less than 200mg  of dietary cholesterol, 20-35 gm of total fiber daily;Understanding of distribution of calorie intake throughout the day with the consumption of 4-5 meals/snacks    Hypertension Yes    Intervention Provide education on lifestyle modifcations including regular physical activity/exercise, weight management, moderate sodium restriction and increased consumption of fresh fruit, vegetables, and low fat dairy, alcohol moderation, and smoking cessation.;Monitor prescription use compliance.    Expected Outcomes Short Term: Continued assessment and intervention until BP is < 140/79mm HG in hypertensive participants. < 130/20mm HG in hypertensive participants with diabetes, heart failure or chronic kidney disease.;Long Term: Maintenance of blood pressure at goal levels.             Core Components/Risk Factors/Patient Goals Review:   Goals and Risk Factor Review     Row Name 11/28/23 0740 11/30/23 9604 01/02/24 0919 01/29/24 5409       Core Components/Risk Factors/Patient Goals Review   Personal Goals Review Weight Management/Obesity;Hypertension Weight  Management/Obesity;Hypertension Weight Management/Obesity;Hypertension Weight Management/Obesity;Hypertension    Review Jeffrey Liu started cardiac rehab on 11/28/23. Jeffrey Liu did well with exercise. Vital  signs were stable. Jeffrey Liu started cardiac rehab on 11/28/23. Jeffrey Liu is off to a good start  with exercise. Vital signs have been stable. Jeffrey Liu is doing well  with exercise. at cardiac rehab, Jeffrey Liu has increased his met levels. Jeffrey Liu feels like he is "getting back to his old self". Jeffrey Liu has maintained sinus rhythm and has lost 1.4 kg since starting cardiac rehab. Jeffrey Liu continues to do  well  with exercise. at cardiac rehab, Jeffrey Liu has increased his met levels. Jeffrey Liu remains in Sinus Rhythm and continues to feel well. Jeffrey Liu will tenatively complete cardiac rehab on 02/17/24.    Expected Outcomes Jeffrey Liu will continue to participate in cardiac rehab for exercise, nutrition and lifestyle modifications. Jeffrey Liu will continue to participate in cardiac rehab for exercise, nutrition and lifestyle modifications. Jeffrey Liu will continue to participate in cardiac rehab for exercise, nutrition and lifestyle modifications. Jeffrey Liu will continue to participate in cardiac rehab for exercise, nutrition and lifestyle modifications.             Core Components/Risk Factors/Patient Goals at Discharge (Final Review):   Goals and Risk Factor Review - 01/29/24 0922       Core Components/Risk Factors/Patient Goals Review   Personal Goals Review Weight Management/Obesity;Hypertension    Review Jeffrey Liu continues to do  well  with exercise. at cardiac rehab, Jeffrey Liu has increased his met levels. Cormac remains in Sinus Rhythm and continues to feel well. Marisa will tenatively complete cardiac rehab on 02/17/24.    Expected Outcomes Jaydan will continue to participate in cardiac rehab for exercise, nutrition and lifestyle modifications.             ITP Comments:  ITP Comments     Row Name 11/22/23 [phone] 11/27/23 1600 11/30/23 0903 01/02/24  0917 01/29/24 0920   ITP Comments Dr. Armanda Magic medical director. Introduction to pritikin education/intensive cardiac rehab. Initial orientation packet reviewed with patient. 30 Day ITP Review. Tasha started cardiac rehab on 11/27/23. Everette did well with exercise. 30 Day ITP Review. Caison started cardiac rehab on 11/27/23. Hulen is off to a good start  with exercise. 30 Day ITP Review. Maureen has good attendance and participation with exercise at  cardiac rehab 30 Day ITP Review. Royal continues to have  good attendance and participation with exercise at cardiac rehab. Thaddeaus will complete cardiac rehab tenatively on 01/17/24.            Comments: See ITP comments.

## 2024-01-31 ENCOUNTER — Encounter (HOSPITAL_COMMUNITY): Payer: Medicare HMO

## 2024-01-31 ENCOUNTER — Encounter (HOSPITAL_COMMUNITY)
Admission: RE | Admit: 2024-01-31 | Discharge: 2024-01-31 | Disposition: A | Discharge status: Home / Self Care | Payer: Medicare HMO | Source: Ambulatory Visit | Attending: Cardiology | Admitting: Cardiology

## 2024-01-31 DIAGNOSIS — Z9889 Other specified postprocedural states: Secondary | ICD-10-CM | POA: Insufficient documentation

## 2024-01-31 DIAGNOSIS — I4892 Unspecified atrial flutter: Secondary | ICD-10-CM | POA: Insufficient documentation

## 2024-02-02 ENCOUNTER — Encounter (HOSPITAL_COMMUNITY): Payer: Medicare HMO

## 2024-02-02 ENCOUNTER — Encounter (HOSPITAL_COMMUNITY)
Admission: RE | Admit: 2024-02-02 | Discharge: 2024-02-02 | Disposition: A | Discharge status: Home / Self Care | Payer: Medicare HMO | Source: Ambulatory Visit | Attending: Cardiology | Admitting: Cardiology

## 2024-02-02 DIAGNOSIS — Z9889 Other specified postprocedural states: Secondary | ICD-10-CM | POA: Diagnosis not present

## 2024-02-05 ENCOUNTER — Encounter (HOSPITAL_COMMUNITY): Payer: Medicare HMO

## 2024-02-05 ENCOUNTER — Encounter (HOSPITAL_COMMUNITY)
Admission: RE | Admit: 2024-02-05 | Discharge: 2024-02-05 | Disposition: A | Discharge status: Home / Self Care | Payer: Medicare HMO | Source: Ambulatory Visit | Attending: Cardiology | Admitting: Cardiology

## 2024-02-05 DIAGNOSIS — I4892 Unspecified atrial flutter: Secondary | ICD-10-CM

## 2024-02-05 DIAGNOSIS — Z9889 Other specified postprocedural states: Secondary | ICD-10-CM

## 2024-02-07 ENCOUNTER — Encounter (HOSPITAL_COMMUNITY): Payer: Medicare HMO

## 2024-02-07 ENCOUNTER — Encounter (HOSPITAL_COMMUNITY): Admission: RE | Admit: 2024-02-07 | Payer: Medicare HMO | Source: Ambulatory Visit

## 2024-02-07 ENCOUNTER — Telehealth (HOSPITAL_COMMUNITY): Payer: Self-pay

## 2024-02-07 NOTE — Telephone Encounter (Signed)
 Patient left message stating he was calling out for 8:15am class due to his dog being sick, will be back Friday.

## 2024-02-09 ENCOUNTER — Encounter (HOSPITAL_COMMUNITY): Payer: Medicare HMO

## 2024-02-09 ENCOUNTER — Encounter (HOSPITAL_COMMUNITY)
Admission: RE | Admit: 2024-02-09 | Discharge: 2024-02-09 | Disposition: A | Discharge status: Home / Self Care | Payer: Medicare HMO | Source: Ambulatory Visit | Attending: Cardiology | Admitting: Cardiology

## 2024-02-09 DIAGNOSIS — Z9889 Other specified postprocedural states: Secondary | ICD-10-CM

## 2024-02-12 ENCOUNTER — Encounter (HOSPITAL_COMMUNITY): Payer: Medicare HMO

## 2024-02-12 ENCOUNTER — Encounter (HOSPITAL_COMMUNITY)
Admission: RE | Admit: 2024-02-12 | Discharge: 2024-02-12 | Disposition: A | Discharge status: Home / Self Care | Payer: Medicare HMO | Source: Ambulatory Visit | Attending: Cardiology | Admitting: Cardiology

## 2024-02-12 DIAGNOSIS — Z9889 Other specified postprocedural states: Secondary | ICD-10-CM

## 2024-02-14 ENCOUNTER — Encounter (HOSPITAL_COMMUNITY): Payer: Medicare HMO

## 2024-02-14 ENCOUNTER — Encounter (HOSPITAL_COMMUNITY)
Admission: RE | Admit: 2024-02-14 | Discharge: 2024-02-14 | Disposition: A | Discharge status: Home / Self Care | Payer: Medicare HMO | Source: Ambulatory Visit | Attending: Cardiology | Admitting: Cardiology

## 2024-02-14 DIAGNOSIS — Z9889 Other specified postprocedural states: Secondary | ICD-10-CM

## 2024-02-14 NOTE — Progress Notes (Signed)
 Discharge Progress Report  Patient Details  Name: Harlo Fabela, MD MRN: [id number] Date of Birth: 23-Jun-1956 Referring Provider:   Flowsheet Row INTENSIVE CARDIAC REHAB ORIENT from 11/22/2023 in [facility]  Referring Provider Donato Schultz, MD        Number of Visits: 56  Reason for Discharge:  Patient reached a stable level of exercise. Patient independent in their exercise. Patient has met program and personal goals.  Smoking History:  Social History   Tobacco Use  Smoking Status Never  Smokeless Tobacco Never    Diagnosis:  10/08/24 S/P aortic valve repair  ADL UCSD:   Initial Exercise Prescription:  Initial Exercise Prescription - 11/22/23 1000       Date of Initial Exercise RX and Referring Provider   Date 11/22/23    Referring Provider Donato Schultz, MD    Expected Discharge Date 02/14/24      Treadmill   MPH 3.2    Grade 0    Minutes 15    METs 3.5      Recumbant Elliptical   Level 2    RPM 60    Watts 90    Minutes 15    METs 3.5      Prescription Details   Frequency (times per week) 3    Duration Progress to 30 minutes of continuous aerobic without signs/symptoms of physical distress      Intensity   THRR 40-80% of Max Heartrate 60-120    Ratings of Perceived Exertion 11-13    Perceived Dyspnea 0-4      Progression   Progression Continue progressive overload as per policy without signs/symptoms or physical distress.      Resistance Training   Training Prescription Yes    Weight 4    Reps 10-15             Discharge Exercise Prescription (Final Exercise Prescription Changes):  Exercise Prescription Changes - 02/14/24 1030       Response to Exercise   Blood Pressure (Admit) 104/54    Blood Pressure (Exit) 112/66    Heart Rate (Admit) 74 bpm    Heart Rate (Exercise) 135 bpm    Heart Rate (Exit) 85 bpm    Rating of Perceived Exertion (Exercise) 12    Symptoms None     Comments Pt graduated from the CRP2 program today    Duration Continue with 30 min of aerobic exercise without signs/symptoms of physical distress.    Intensity THRR unchanged      Progression   Progression Continue to progress workloads to maintain intensity without signs/symptoms of physical distress.    Average METs 8.4      Resistance Training   Training Prescription No    Weight No weights on Wednesdays      Interval Training   Interval Training No      T5 Nustep   Level 7    SPM 128    Minutes 15    METs 6.9      Rower   Level 5    Watts 168    Minutes 15    METs 9.95      Home Exercise Plan   Plans to continue exercise at Home (comment)    Frequency Add 3 additional days to program exercise sessions.    Initial Home Exercises Provided 12/22/23             Functional Capacity:  6 Minute Walk  Row Name 11/22/23 1003 02/09/24 0915       6 Minute Walk   Phase Initial Discharge    Distance 1820 feet 2616 feet    Distance % Change -- 43.74 %    Distance Feet Change -- 796 ft    Walk Time 6 minutes 6 minutes    # of Rest Breaks 0 0    MPH 3.45 5    METS 4.36 6.2    RPE 10 11    Perceived Dyspnea  1.5 0    VO2 Peak 15.26 21.61    Symptoms Yes (comment) No    Comments 2/10 incision/sternal pain, SOB. Resolved with rest --    Resting HR 60 bpm 60 bpm    Resting BP 112/58 102/52    Resting Oxygen Saturation  98 % --    Exercise Oxygen Saturation  during 6 min walk 96 % --    Max Ex. HR 95 bpm 112 bpm    Max Ex. BP 146/60 160/60    2 Minute Post BP 128/62 --             Psychological, QOL, Others - Outcomes: PHQ 2/9:    02/14/2024    9:08 AM 11/22/2023    8:23 AM  Depression screen PHQ 2/9  Decreased Interest 0 0  Down, Depressed, Hopeless 0 0  PHQ - 2 Score 0 0  Altered sleeping 0 2  Tired, decreased energy 0 0  Change in appetite 0 0  Feeling bad or failure about yourself  0 0  Trouble concentrating 0 0  Moving slowly or  fidgety/restless 0 0  Suicidal thoughts 0 0  PHQ-9 Score 0 2  Difficult doing work/chores Not difficult at all Not difficult at all    Quality of Life:  Quality of Life - 02/13/24 1238       Quality of Life Scores   Health/Function Pre 28.7 %    Health/Function Post 28.83 %    Health/Function % Change 0.45 %    Socioeconomic Pre 30 %    Socioeconomic Post 27.81 %    Socioeconomic % Change  -7.3 %    Psych/Spiritual Pre 27.14 %    Psych/Spiritual Post 27.86 %    Psych/Spiritual % Change 2.65 %    Family Pre 30 %    Family Post 29.5 %    Family % Change -1.67 %    GLOBAL Pre 28.84 %    GLOBAL Post 28.5 %    GLOBAL % Change -1.18 %             Personal Goals: Goals established at orientation with interventions provided to work toward goal.  Personal Goals and Risk Factors at Admission - 11/22/23 0831       Core Components/Risk Factors/Patient Goals on Admission    Weight Management Yes;Weight Maintenance    Intervention Weight Management: Develop a combined nutrition and exercise program designed to reach desired caloric intake, while maintaining appropriate intake of nutrient and fiber, sodium and fats, and appropriate energy expenditure required for the weight goal.;Weight Management: Provide education and appropriate resources to help participant work on and attain dietary goals.    Expected Outcomes Short Term: Continue to assess and modify interventions until short term weight is achieved;Long Term: Adherence to nutrition and physical activity/exercise program aimed toward attainment of established weight goal;Weight Maintenance: Understanding of the daily nutrition guidelines, which includes 25-35% calories from fat, 7% or less cal from saturated fats, less than  200mg  cholesterol, less than 1.5gm of sodium, & 5 or more servings of fruits and vegetables daily;Understanding recommendations for meals to include 15-35% energy as protein, 25-35% energy from fat, 35-60% energy  from carbohydrates, less than 200mg  of dietary cholesterol, 20-35 gm of total fiber daily;Understanding of distribution of calorie intake throughout the day with the consumption of 4-5 meals/snacks    Hypertension Yes    Intervention Provide education on lifestyle modifcations including regular physical activity/exercise, weight management, moderate sodium restriction and increased consumption of fresh fruit, vegetables, and low fat dairy, alcohol moderation, and smoking cessation.;Monitor prescription use compliance.    Expected Outcomes Short Term: Continued assessment and intervention until BP is < 140/62mm HG in hypertensive participants. < 130/47mm HG in hypertensive participants with diabetes, heart failure or chronic kidney disease.;Long Term: Maintenance of blood pressure at goal levels.              Personal Goals Discharge:  Goals and Risk Factor Review     Row Name 11/28/23 0740 11/30/23 0960 01/02/24 0919 01/29/24 4540       Core Components/Risk Factors/Patient Goals Review   Personal Goals Review Weight Management/Obesity;Hypertension Weight Management/Obesity;Hypertension Weight Management/Obesity;Hypertension Weight Management/Obesity;Hypertension    Review Suhail started cardiac rehab on 11/28/23. Jahir did well with exercise. Vital signs were stable. Onie started cardiac rehab on 11/28/23. Rawn is off to a good start  with exercise. Vital signs have been stable. Emani is doing well  with exercise. at cardiac rehab, Naziah has increased his met levels. Zakir feels like he is "getting back to his old self". Delquan has maintained sinus rhythm and has lost 1.4 kg since starting cardiac rehab. Quinn continues to do  well  with exercise. at cardiac rehab, Lorenzo has increased his met levels. Ramzi remains in Sinus Rhythm and continues to feel well. Ariv will tenatively complete cardiac rehab on 02/17/24.    Expected Outcomes Puneet will continue to participate in cardiac rehab for  exercise, nutrition and lifestyle modifications. Dwyane will continue to participate in cardiac rehab for exercise, nutrition and lifestyle modifications. Shane will continue to participate in cardiac rehab for exercise, nutrition and lifestyle modifications. Einar will continue to participate in cardiac rehab for exercise, nutrition and lifestyle modifications.             Exercise Goals and Review:  Exercise Goals     Row Name 11/22/23 0745             Exercise Goals   Increase Physical Activity Yes       Intervention Provide advice, education, support and counseling about physical activity/exercise needs.;Develop an individualized exercise prescription for aerobic and resistive training based on initial evaluation findings, risk stratification, comorbidities and participant's personal goals.       Expected Outcomes Short Term: Attend rehab on a regular basis to increase amount of physical activity.;Long Term: Exercising regularly at least 3-5 days a week.;Long Term: Add in home exercise to make exercise part of routine and to increase amount of physical activity.       Increase Strength and Stamina Yes       Intervention Provide advice, education, support and counseling about physical activity/exercise needs.;Develop an individualized exercise prescription for aerobic and resistive training based on initial evaluation findings, risk stratification, comorbidities and participant's personal goals.       Expected Outcomes Short Term: Increase workloads from initial exercise prescription for resistance, speed, and METs.;Short Term: Perform resistance training exercises routinely during rehab and add in resistance  training at home;Long Term: Improve cardiorespiratory fitness, muscular endurance and strength as measured by increased METs and functional capacity ( )       Able to understand and use rate of perceived exertion (RPE) scale Yes       Intervention Provide education and explanation  on how to use RPE scale       Expected Outcomes Short Term: Able to use RPE daily in rehab to express subjective intensity level;Long Term:  Able to use RPE to guide intensity level when exercising independently       Knowledge and understanding of Target Heart Rate Range (THRR) Yes       Intervention Provide education and explanation of THRR including how the numbers were predicted and where they are located for reference       Expected Outcomes Short Term: Able to state/look up THRR;Short Term: Able to use daily as guideline for intensity in rehab;Long Term: Able to use THRR to govern intensity when exercising independently       Understanding of Exercise Prescription Yes       Intervention Provide education, explanation, and written materials on patient's individual exercise prescription       Expected Outcomes Short Term: Able to explain program exercise prescription;Long Term: Able to explain home exercise prescription to exercise independently                Exercise Goals Re-Evaluation:  Exercise Goals Re-Evaluation     Row Name 11/27/23 1416 12/22/23 1640 01/17/24 1613 02/14/24 1030       Exercise Goal Re-Evaluation   Exercise Goals Review Increase Physical Activity;Understanding of Exercise Prescription;Increase Strength and Stamina;Knowledge and understanding of Target Heart Rate Range (THRR);Able to understand and use rate of perceived exertion (RPE) scale Increase Physical Activity;Understanding of Exercise Prescription;Increase Strength and Stamina;Knowledge and understanding of Target Heart Rate Range (THRR);Able to understand and use rate of perceived exertion (RPE) scale Increase Physical Activity;Understanding of Exercise Prescription;Increase Strength and Stamina;Knowledge and understanding of Target Heart Rate Range (THRR);Able to understand and use rate of perceived exertion (RPE) scale Increase Physical Activity;Understanding of Exercise Prescription;Increase Strength and  Stamina;Knowledge and understanding of Target Heart Rate Range (THRR);Able to understand and use rate of perceived exertion (RPE) scale    Comments Pt's first day in the CRP2 program. Pt understands the exercise Rx, RPE scale and THRR. Reviewed METs, goals and Home exercise Rx today. Pt is making progress on his goals of imrpoved strength and stamina. Pt is riding his bike at home and feels that he is almost back to pre surgery baseline. Peak MET sre 9.0. Pt has been riding the bike and walking at home 2-3x/week and will continue this. Reviewed METs, goals and Home exercise Rx today. Pt continues to make progress on his goals of imrpoved strength and stamina. Pt is riding his bike at home and feels that he is almost back to pre surgery baseline. Peak MET are 10.5. Pt has been riding the bike and walking at home 2-3x/week and will continue this. Pt wants to start HIIT training on the Nustep, so we will begin that next session. Pt graduated from the CRP2 program today. Pt achieved all his goals: increase strength and stamina, pt is back to using the rower, bike and multi-gym at home. Pt's peak METs were an impressive 12.1. Pt will continue to exercise at home by using the the rower, stationary bike, bike, multi-gym, and walking.    Expected Outcomes Will continue to montior patient and progress  exercise workloads as tolerated. Will continue to montior patient and progress exercise workloads as tolerated. Will continue to montior patient and progress exercise workloads as tolerated. Pt will continue to exercise at home on his own.             Nutrition & Weight - Outcomes:  Pre Biometrics - 11/22/23 0820       Pre Biometrics   Waist Circumference 37 inches    Hip Circumference 38 inches    Waist to Hip Ratio 0.97 %    Triceps Skinfold 11 mm    % Body Fat 22.7 %    Grip Strength 54 kg    Flexibility 11.25 in   knees bent. with knees straight/flat could not reach   Single Leg Stand 30 seconds               Nutrition:  Nutrition Therapy & Goals - 01/22/24 1038       Nutrition Therapy   Diet Heart Healthy Diet    Drug/Food Interactions Statins/Certain Fruits      Personal Nutrition Goals   Nutrition Goal Patient to identify strategies for reducing cardiovascular risk by attending the [employer] education and nutrition series weekly.   goal in progress.   Personal Goal #2 Patient to improve diet quality by using the plate method as a guide for meal planning to include lean protein/plant protein, fruits, vegetables, whole grains, nonfat dairy as part of a well-balanced diet.   goal in progress.   Comments Goals in progress. Markice continues to attend the [facility] education and nutrition series reguarly. 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 He is down 1.3# since starting with our program. He continues to adhere to a Pritikin diet. Patient will benefit from participation in intensive cardiac rehab for nutrition, exercise, and lifestyle modification.      Intervention Plan   Intervention Prescribe, educate and counsel regarding individualized specific dietary modifications aiming towards targeted core components such as weight, hypertension, lipid management, diabetes, heart failure and other comorbidities.;Nutrition handout(s) given to patient.    Expected Outcomes Short Term Goal: Understand basic principles of dietary content, such as calories, fat, sodium, cholesterol and nutrients.;Long Term Goal: Adherence to prescribed nutrition plan.             Nutrition Discharge:   Education Questionnaire Score:  Knowledge Questionnaire Score - 02/13/24 1238       Knowledge Questionnaire Score   Post Score 24/24             Goals reviewed with patient; copy given to patient.Pt graduates from  Intensive/Traditional cardiac rehab program on 02/14/24  with completion of 27  exercise and  28 education sessions. Pt  maintained good attendance and progressed nicely during their participation in rehab as evidenced by increased MET level. Treyveon increased his distance on his post exercise walk test by 796 feet.  Medication list reconciled. Repeat  PHQ score- 0 .  Pt has made significant lifestyle changes and should be commended for his success. Voyd achieved their goals during cardiac rehab.   Pt plans to continue exercise at home by walking using his rower, stationary bike,biking and  home multi gym. Dr Smitty Cords says that participating in cardiac rehab has been very helpful for him. We are  proud of Dr Roderic Ovens progress!Thayer Headings RN BSN

## 2024-03-22 ENCOUNTER — Ambulatory Visit: Payer: Medicare HMO | Attending: Cardiology | Admitting: Cardiology

## 2024-03-22 VITALS — BP 126/68 | HR 67 | Resp 16 | Ht 76.0 in | Wt 190.0 lb

## 2024-03-22 DIAGNOSIS — I4892 Unspecified atrial flutter: Secondary | ICD-10-CM | POA: Diagnosis not present

## 2024-03-22 DIAGNOSIS — Z8679 Personal history of other diseases of the circulatory system: Secondary | ICD-10-CM | POA: Diagnosis not present

## 2024-03-22 DIAGNOSIS — I4891 Unspecified atrial fibrillation: Secondary | ICD-10-CM | POA: Diagnosis not present

## 2024-03-22 DIAGNOSIS — Z9889 Other specified postprocedural states: Secondary | ICD-10-CM

## 2024-03-22 DIAGNOSIS — I9789 Other postprocedural complications and disorders of the circulatory system, not elsewhere classified: Secondary | ICD-10-CM | POA: Diagnosis not present

## 2024-03-22 DIAGNOSIS — D6869 Other thrombophilia: Secondary | ICD-10-CM

## 2024-03-22 NOTE — Patient Instructions (Signed)
 Medication Instructions:  Your physician recommends that you continue on your current medications as directed. Please refer to the Current Medication list given to you today.  *If you need a refill on your cardiac medications before your next appointment, please call your pharmacy*  Follow-Up: At [facility], you and your health needs are our priority.  As part of our continuing mission to provide you with exceptional heart care, our providers are all part of one team.  This team includes your primary Cardiologist (physician) and Advanced Practice Providers or APPs (Physician Assistants and Nurse Practitioners) who all work together to provide you with the care you need, when you need it.  Your next appointment:   6 months  Provider:   Ardeen Kohler, MD

## 2024-03-22 NOTE — Progress Notes (Signed)
 Electrophysiology Office Note:   Date:  03/22/2024  ID:  Jeffrey Moose, Jeffrey Liu, DOB [birth year], MRN [id number]  Primary Cardiologist: Dorothye Gathers, Jeffrey Liu Electrophysiologist: Ardeen Kohler, Jeffrey Liu      History of Present Illness:   Jeffrey Moose, Jeffrey Liu is a 68 y.o. male with h/o ascending aortic aneurysm and moderate-severe AI s/p aortic root replacement and aortic valve resuspension on October 09, 2023 at [facility] who is being seen today for EP evaluation.   Patient reports doing relatively well since his last clinic visit.  He has used an Scientist, physiological to monitor his heart rate and rhythm.  He reports no known recurrences of atrial arrhythmias.  He has returned back to his baseline level of activity.  He is doing strenuous exercise on a daily basis without issue.  He has no new or acute complaints today.    Review of systems complete and found to be negative unless listed in HPI.   EP Information / Studies Reviewed:    EKG is ordered today. Personal review as below.  EKG Interpretation Date/Time:  Friday Mar 22 2024 13:39:20 EDT Ventricular Rate:  67 PR Interval:  170 QRS Duration:  96 QT Interval:  438 QTC Calculation: 462 R Axis:   51  Text Interpretation: Normal sinus rhythm Normal ECG When compared with ECG of 15-Dec-2023 08:57, Nonspecific T wave abnormality has replaced inverted T waves in Anterior leads Confirmed by Ardeen Kohler [phone]) on 03/22/2024 1:45:15 PM   EKG 12/12/23: AFL   01/24/24 Echo:  1. Left ventricular ejection fraction, by estimation, is 60 to 65%. The  left ventricle has normal function. The left ventricle has no regional  wall motion abnormalities. Left ventricular diastolic parameters were  normal. The average left ventricular  global longitudinal strain is -21.5 %. The global longitudinal strain is  normal.   2. Right ventricular systolic function is normal. The right ventricular  size is moderately enlarged.   3. Right atrial size was moderately dilated.   4. The  mitral valve is normal in structure. Trivial mitral valve  regurgitation. No evidence of mitral stenosis.   5. The aortic valve is normal in structure. Aortic valve regurgitation is  mild. No aortic stenosis is present. Aortic regurgitation PHT measures 769  msec. Aortic valve area, by VTI measures 1.92 cm. Aortic valve mean  gradient measures 11.0 mmHg. Aortic   valve Vmax measures 2.09 m/s.   6. Aortic root/ascending aorta has been repaired/replaced.   7. The inferior vena cava is normal in size with greater than 50%  respiratory variability, suggesting right atrial pressure of 3 mmHg.   8. Compared to prior echo 08/2023, the aortic root has been replaced with  no aneurysm present and AV resuspended with only mild AI.   Risk Assessment/Calculations:    CHA2DS2-VASc Score = 2   This indicates a 2.2% annual risk of stroke. The patient's score is based upon: CHF History: 0 HTN History: 0 Diabetes History: 0 Stroke History: 0 Vascular Disease History: 1 Age Score: 1 Gender Score: 0             Physical Exam:   VS:  BP 126/68 (BP Location: Left Arm, Patient Position: Sitting, Cuff Size: Normal)   Pulse 67   Resp 16   Ht 6\' 4"  (1.93 m)   Wt 190 lb (86.2 kg)   SpO2 98%   BMI 23.13 kg/m    Wt Readings from Last 3 Encounters:  03/22/24 190 lb (86.2 kg)  01/16/24 189 lb  12.8 oz (86.1 kg)  12/26/23 183 lb (83 kg)     GEN: Well nourished, well developed in no acute distress NECK: No JVD CARDIAC: Normal rate, regular rhythm. RESPIRATORY:  Clear to auscultation without rales, wheezing or rhonchi  ABDOMEN: Soft, non-distended EXTREMITIES:  No edema; No deformity   ASSESSMENT AND PLAN:    #. Post operative atrial flutter: Appears typical by ECG but given recent cardiac surgery, cannot exclude atypical.  #. Post operative atrial fibrillation: #. 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 - He has metoprolol  to take as needed if  he were to have a sustained episode. - Continue Eliquis  5mg  BID.  We extensively discussed risk and benefits of anticoagulation use for stroke prophylaxis in the setting of postoperative atrial fibrillation.  Patient feels more comfortable remaining on anticoagulation for now but is open to the idea of discontinuing in the future if he does not have any more episodes.  #. 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  -Repeat echocardiogram showed normal LVEF, mild aortic regurgitation. -Continue close follow up with primary cardiologist, Dr. [name].   Follow up with Dr. Daneil Dunker in 6 months.  Signed, Ardeen Kohler, Jeffrey Liu

## 2024-03-29 ENCOUNTER — Other Ambulatory Visit (HOSPITAL_COMMUNITY): Payer: Self-pay | Admitting: Internal Medicine

## 2024-03-29 DIAGNOSIS — R1010 Upper abdominal pain, unspecified: Secondary | ICD-10-CM | POA: Diagnosis not present

## 2024-03-29 DIAGNOSIS — K7689 Other specified diseases of liver: Secondary | ICD-10-CM

## 2024-03-29 DIAGNOSIS — D649 Anemia, unspecified: Secondary | ICD-10-CM | POA: Diagnosis not present

## 2024-03-30 ENCOUNTER — Encounter: Payer: Self-pay | Admitting: Cardiology

## 2024-03-31 ENCOUNTER — Encounter (HOSPITAL_COMMUNITY): Payer: Self-pay

## 2024-03-31 ENCOUNTER — Ambulatory Visit (HOSPITAL_COMMUNITY)
Admission: RE | Admit: 2024-03-31 | Discharge: 2024-03-31 | Disposition: A | Discharge status: Home / Self Care | Source: Ambulatory Visit | Attending: Internal Medicine | Admitting: Internal Medicine

## 2024-03-31 DIAGNOSIS — N281 Cyst of kidney, acquired: Secondary | ICD-10-CM | POA: Diagnosis not present

## 2024-03-31 DIAGNOSIS — D1803 Hemangioma of intra-abdominal structures: Secondary | ICD-10-CM | POA: Diagnosis not present

## 2024-03-31 DIAGNOSIS — R932 Abnormal findings on diagnostic imaging of liver and biliary tract: Secondary | ICD-10-CM | POA: Diagnosis not present

## 2024-03-31 DIAGNOSIS — K7689 Other specified diseases of liver: Secondary | ICD-10-CM | POA: Diagnosis not present

## 2024-03-31 MED ORDER — GADOBUTROL 1 MMOL/ML IV SOLN
9.0000 mL | Freq: Once | INTRAVENOUS | Status: AC | PRN
  Administered 2024-03-31: 9 mL via INTRAVENOUS

## 2024-04-01 ENCOUNTER — Encounter: Payer: Self-pay | Admitting: Cardiology

## 2024-04-01 NOTE — Telephone Encounter (Signed)
 Please review and advise.

## 2024-04-02 ENCOUNTER — Encounter: Payer: Self-pay | Admitting: Cardiology

## 2024-04-02 DIAGNOSIS — D649 Anemia, unspecified: Secondary | ICD-10-CM | POA: Diagnosis not present

## 2024-04-02 NOTE — Telephone Encounter (Signed)
 Pt following up

## 2024-04-02 NOTE — Telephone Encounter (Signed)
 Please review and advise.

## 2024-04-03 ENCOUNTER — Ambulatory Visit: Admitting: Cardiology

## 2024-04-03 NOTE — Telephone Encounter (Signed)
 Spoke with patient, scheduled appointment with Dr Daneil Dunker at patient request to discuss possible ablation on 04/16/24 at 9:30 am. No further needs at this time

## 2024-04-09 DIAGNOSIS — I4891 Unspecified atrial fibrillation: Secondary | ICD-10-CM

## 2024-04-09 DIAGNOSIS — I4892 Unspecified atrial flutter: Secondary | ICD-10-CM

## 2024-04-14 ENCOUNTER — Other Ambulatory Visit: Payer: Self-pay | Admitting: Cardiology

## 2024-04-15 NOTE — Telephone Encounter (Signed)
 Prescription refill request for Eliquis  received. Indication:aflutter Last office visit:5/25 Scr:0.87  2/25 Age: 68 Weight:86.2  kg  Prescription refilled

## 2024-04-16 ENCOUNTER — Ambulatory Visit: Admitting: Cardiology

## 2024-05-07 DIAGNOSIS — M65331 Trigger finger, right middle finger: Secondary | ICD-10-CM | POA: Diagnosis not present

## 2024-05-16 DIAGNOSIS — H401131 Primary open-angle glaucoma, bilateral, mild stage: Secondary | ICD-10-CM | POA: Diagnosis not present

## 2024-05-16 DIAGNOSIS — H5203 Hypermetropia, bilateral: Secondary | ICD-10-CM | POA: Diagnosis not present

## 2024-05-17 DIAGNOSIS — D649 Anemia, unspecified: Secondary | ICD-10-CM | POA: Diagnosis not present

## 2024-05-30 DIAGNOSIS — I4892 Unspecified atrial flutter: Secondary | ICD-10-CM | POA: Diagnosis not present

## 2024-05-30 DIAGNOSIS — I4891 Unspecified atrial fibrillation: Secondary | ICD-10-CM | POA: Diagnosis not present

## 2024-05-30 DIAGNOSIS — I9789 Other postprocedural complications and disorders of the circulatory system, not elsewhere classified: Secondary | ICD-10-CM | POA: Diagnosis not present

## 2024-05-31 LAB — CBC
Hematocrit: 40.5 % (ref 37.5–51.0)
Hemoglobin: 12.8 g/dL — ABNORMAL LOW (ref 13.0–17.7)
MCH: 28.5 pg (ref 26.6–33.0)
MCHC: 31.6 g/dL (ref 31.5–35.7)
MCV: 90 fL (ref 79–97)
Platelets: 225 x10E3/uL (ref 150–450)
RBC: 4.49 x10E6/uL (ref 4.14–5.80)
RDW: 13.7 % (ref 11.6–15.4)
WBC: 4.4 x10E3/uL (ref 3.4–10.8)

## 2024-05-31 LAB — BASIC METABOLIC PANEL WITH GFR
BUN/Creatinine Ratio: 24 (ref 10–24)
BUN: 22 mg/dL (ref 8–27)
CO2: 24 mmol/L (ref 20–29)
Calcium: 9.2 mg/dL (ref 8.6–10.2)
Chloride: 101 mmol/L (ref 96–106)
Creatinine, Ser: 0.93 mg/dL (ref 0.76–1.27)
Glucose: 82 mg/dL (ref 70–99)
Potassium: 4.6 mmol/L (ref 3.5–5.2)
Sodium: 139 mmol/L (ref 134–144)
eGFR: 88 mL/min/1.73 (ref >59)

## 2024-06-02 ENCOUNTER — Ambulatory Visit: Payer: Self-pay | Admitting: Cardiology

## 2024-06-04 ENCOUNTER — Telehealth: Payer: Self-pay

## 2024-06-04 DIAGNOSIS — K219 Gastro-esophageal reflux disease without esophagitis: Secondary | ICD-10-CM

## 2024-06-04 DIAGNOSIS — R1013 Epigastric pain: Secondary | ICD-10-CM

## 2024-06-04 NOTE — Telephone Encounter (Signed)
 Request for surgical clearance:     Endoscopy Procedure  What type of surgery is being performed?     EGD  When is this surgery scheduled?     06/12/24  What type of clearance is required ?   Pharmacy  Are there any medications that need to be held prior to surgery and how long? Eliquis  x2 days prior to procedure   Practice name and name of physician performing surgery?      [facility]  What is your office phone and fax number?      Phone- [phone]  Fax- [phone]  Anesthesia type (None, local, MAC, general) ?       MAC  Please route your response to Blondie Barks, CMA

## 2024-06-04 NOTE — Telephone Encounter (Signed)
 Patient has been scheduled for EGD on 06/12/24 at 7:30 am in [facility] with Dr Wilhelmenia. All information has been sent to patient via my-chart.   Clearance for Eliquis  has been sent Cardiology-Dr Jeffrie.   Pre-visit(my chart video)  has been scheduled for 06/05/24 at 11:00 am    From: Mansouraty, Aloha Raddle., MD  Sent: 05/28/2024   6:32 AM EDT  To: Dorn DELENA Sauers, MD; Blondie Barks, CMA  Subject: RE: [facility] Patient                             JAH,  Thanks for reaching out.  He is UTD on his colonoscopy, so I wouldn't redo that currently.  I think at his age with progressive GERD/dyspepsia and with his mild Iron insufficiency, an EGD is indicated at this time.  My next open/available EGD slot is early September.  I'll have my team work on getting him scheduled for an EGD at this time.  If I have an opening, I'll keep him in mind to get him done sooner if he agrees.  Thanks.  GM   Jesselee Poth,  Please reach out Dr. Burnis.  Let him know that based on his stable anemia but iron insufficiency and his GERD/dyspepsia symptoms, he is recommended an EGD.  Please schedule him for first available EGD with me in the [facility].  If the [facility] will let me do a 730AM slot or a 100PM slot, I would like to try and fit him in on my full [facility] day 8/13.  Let's see what you may be able to arrange.  For now keep the appointment with Camie until we get this plan finalized.  Thanks.  GM  ----- Message -----  From: Sauers Dorn DELENA, MD  Sent: 05/27/2024   5:21 PM EDT  To: Aloha Wilhelmenia Raddle., MD  Subject: [facility] Patient                                 Hello Dr. Wilhelmenia - My name is Dorn Sauers.  I am an internal medicine doctor with [facility].  Dr. Burnis is a mutual patient - he has had dyspepsia for the last few months despite PPI (omeprazole 40mg  once daily after taking BID for initially for a couple of weeks).  Labs concerning for new onset anemia which over a 6 week period was found to be  stable and mild (Hgb 12.2 on 7/18). Ferritin and iron panel are consistent with iron deficiency despite being on oral iron replacement.  FOBT/Fit was negative.  I have asked that he follow-up with his GI doctor to address these ongoing issues.  He is, understandably, anxious.  He is already scheduled with a GI mid-level on 8/29 but is hoping for an earlier appointment.  Would there be any way to expedite his GI appointment?  I appreciate your consideration.   -Thom (okay to call or text if needed)

## 2024-06-05 ENCOUNTER — Encounter: Payer: Self-pay | Admitting: Gastroenterology

## 2024-06-05 ENCOUNTER — Ambulatory Visit (AMBULATORY_SURGERY_CENTER)

## 2024-06-05 VITALS — Ht 76.0 in | Wt 185.0 lb

## 2024-06-05 DIAGNOSIS — D509 Iron deficiency anemia, unspecified: Secondary | ICD-10-CM

## 2024-06-05 DIAGNOSIS — R1013 Epigastric pain: Secondary | ICD-10-CM

## 2024-06-05 NOTE — Telephone Encounter (Signed)
 Patient with diagnosis of atrial fibrillation on Eliquis  for anticoagulation.    What type of surgery is being performed?     EGD   When is this surgery scheduled?     06/12/24   CHA2DS2-VASc Score = 2   This indicates a 2.2% annual risk of stroke. The patient's score is based upon: CHF History: 0 HTN History: 0 Diabetes History: 0 Stroke History: 0 Vascular Disease History: 1 Age Score: 1 Gender Score: 0    CrCl 90  Platelet count 225  Patient is scheduled for AF ablation on August 14.  He can't hold anticoagulation in the 3 weeks prior or 3 months after.  One or the other will need to be rescheduled.     **This guidance is not considered finalized until pre-operative APP has relayed final recommendations.**

## 2024-06-05 NOTE — Telephone Encounter (Signed)
   Patient Name: Edris Friedt, MD  DOB: 1956/10/09 MRN: [id number]  Primary Cardiologist: Oneil Parchment, MD  Clinical pharmacists have reviewed the patient's past medical history, labs, and current medications as part of preoperative protocol coverage. The following recommendations have been made:   Patient with diagnosis of atrial fibrillation on Eliquis  for anticoagulation.     What type of surgery is being performed?     EGD   When is this surgery scheduled?     06/12/24     CHA2DS2-VASc Score = 2   This indicates a 2.2% annual risk of stroke. The patient's score is based upon: CHF History: 0 HTN History: 0 Diabetes History: 0 Stroke History: 0 Vascular Disease History: 1 Age Score: 1 Gender Score: 0     CrCl 90             Platelet count 225   Patient is scheduled for AF ablation on August 14.  He can't hold anticoagulation in the 3 weeks prior or 3 months after.  One or the other will need to be rescheduled.  Reschedule EDGplease unless urgent.      I will route this recommendation to the requesting party via Epic fax function and remove from pre-op pool.  Please call with questions.  Lamarr Satterfield, NP 06/05/2024, 8:53 AM

## 2024-06-05 NOTE — Progress Notes (Signed)
 No egg or soy allergy known to patient  No issues known to pt with past sedation with any surgeries or procedures Patient denies ever being told they had issues or difficulty with intubation  No FH of Malignant Hyperthermia Pt is not on diet pills Pt is not on  home 02  On Eliquis   Hx A fib or A flutter. Currently in NSR Have any cardiac testing pending-- ablation 8/14 LOA: independent    PV completed with patient. Prep instructions sent via mychart

## 2024-06-05 NOTE — Telephone Encounter (Signed)
FYI:  Dr Mansouraty  

## 2024-06-05 NOTE — Telephone Encounter (Signed)
 Pharmacy please advise on holding Eliquis  prior to EGD scheduled for 06/12/2024. Last labs 05/30/2024. Thank you.

## 2024-06-06 ENCOUNTER — Telehealth (HOSPITAL_COMMUNITY): Payer: Self-pay

## 2024-06-06 NOTE — Telephone Encounter (Signed)
 Spoke with patient to discuss upcoming procedure.   Labs: completed.   Any recent signs of acute illness or been started on antibiotics? No Any new medications started? No Any medications to hold? No Any missed doses of blood thinner? No Advised patient to continue taking ANTICOAGULANT: Eliquis  (Apixaban ) twice daily without missing any doses.  Medication instructions:  On the morning of your procedure DO NOT take any medication., including Eliquis  or the procedure may be rescheduled. Nothing to eat or drink after midnight prior to your procedure.  Confirmed patient is scheduled for Atrial Fibrillation Ablation on Thursday, August 14 with Dr. Sidra Kitty. Instructed patient to arrive at the Main Entrance A at [facility]: [address] [place], [place] [address] and check in at Admitting at 5:30 AM.  Advised of plan to go home the same day and will only stay overnight if medically necessary. You MUST have a responsible adult to drive you home and MUST be with you the first 24 hours after you arrive home or your procedure could be cancelled.  Patient verbalized understanding to all instructions provided and agreed to proceed with procedure.

## 2024-06-06 NOTE — Telephone Encounter (Signed)
 I called and spoke with Dr. Burnis on 8/6. Extensive discussion, and his symptoms are really more dyspepsia than dysphagia symptoms. They persist even on high-dose PPI therapy. No weight loss. Iron deficiency improving, but he still has concerns. As he has had recent colonoscopy, next steps are upper endoscopy as we have scheduled. I am okay with the patient undergoing diagnostic EGD on anticoagulation without stopping.  We can take biopsies if necessary. Patient states he does not want to postpone his endoscopy, as he is more concerned about his dyspepsia symptoms. I am okay with this. If EGD is unremarkable, a video capsule endoscopy. I think this will allow us  to be able to do to optimize his care and move things along and still has some risk of increased bleeding, but should be able to mitigate things as best we can.  FYI [facility] and JP, we will not stop anticoagulation for EGD.  GM

## 2024-06-07 ENCOUNTER — Encounter: Admitting: Gastroenterology

## 2024-06-12 ENCOUNTER — Ambulatory Visit: Admitting: Gastroenterology

## 2024-06-12 ENCOUNTER — Encounter: Admitting: Internal Medicine

## 2024-06-12 ENCOUNTER — Encounter: Payer: Self-pay | Admitting: Gastroenterology

## 2024-06-12 VITALS — BP 108/50 | HR 50 | Temp 98.1°F | Resp 11 | Ht 76.0 in | Wt 185.0 lb

## 2024-06-12 DIAGNOSIS — K2289 Other specified disease of esophagus: Secondary | ICD-10-CM | POA: Diagnosis not present

## 2024-06-12 DIAGNOSIS — K449 Diaphragmatic hernia without obstruction or gangrene: Secondary | ICD-10-CM

## 2024-06-12 DIAGNOSIS — F418 Other specified anxiety disorders: Secondary | ICD-10-CM | POA: Diagnosis not present

## 2024-06-12 DIAGNOSIS — K3189 Other diseases of stomach and duodenum: Secondary | ICD-10-CM

## 2024-06-12 DIAGNOSIS — E611 Iron deficiency: Secondary | ICD-10-CM

## 2024-06-12 DIAGNOSIS — K229 Disease of esophagus, unspecified: Secondary | ICD-10-CM | POA: Diagnosis not present

## 2024-06-12 DIAGNOSIS — E785 Hyperlipidemia, unspecified: Secondary | ICD-10-CM | POA: Diagnosis not present

## 2024-06-12 DIAGNOSIS — K219 Gastro-esophageal reflux disease without esophagitis: Secondary | ICD-10-CM

## 2024-06-12 DIAGNOSIS — D509 Iron deficiency anemia, unspecified: Secondary | ICD-10-CM

## 2024-06-12 DIAGNOSIS — R1013 Epigastric pain: Secondary | ICD-10-CM

## 2024-06-12 MED ORDER — OMEPRAZOLE 40 MG PO CPDR: 40.0000 mg | DELAYED_RELEASE_CAPSULE | Freq: Two times a day (BID) | ORAL | 3 refills | Status: AC

## 2024-06-12 MED ORDER — SODIUM CHLORIDE 0.9 % IV SOLN: 500.0000 mL | Freq: Once | INTRAVENOUS | Status: AC

## 2024-06-12 NOTE — Progress Notes (Signed)
 A/o x 3, VSS, gd SR's, pleased with anesthesia, report to RN

## 2024-06-12 NOTE — Patient Instructions (Addendum)
 Resume previous diet. Continue present medications. Including anticoagulation and aspirin today. Awaiting pathology results. Will increase omeprazole  to 40 mg twice daily for 1 month to see if any differences in symptoms, as we await pathology finding. Prescription sent to pharmacy of choice. Handout provided on hiatal hernia.  YOU HAD AN ENDOSCOPIC PROCEDURE TODAY AT THE [facility]:   Refer to the procedure report that was given to you for any specific questions about what was found during the examination.  If the procedure report does not answer your questions, please call your gastroenterologist to clarify.  If you requested that your care partner not be given the details of your procedure findings, then the procedure report has been included in a sealed envelope for you to review at your convenience later.  YOU SHOULD EXPECT: Some feelings of bloating in the abdomen. Passage of more gas than usual.  Walking can help get rid of the air that was put into your GI tract during the procedure and reduce the bloating. If you had a lower endoscopy (such as a colonoscopy or flexible sigmoidoscopy) you may notice spotting of blood in your stool or on the toilet paper. If you underwent a bowel prep for your procedure, you may not have a normal bowel movement for a few days.  Please Note:  You might notice some irritation and congestion in your nose or some drainage.  This is from the oxygen used during your procedure.  There is no need for concern and it should clear up in a day or so.  SYMPTOMS TO REPORT IMMEDIATELY:  Following upper endoscopy (EGD)  Vomiting of blood or coffee ground material  New chest pain or pain under the shoulder blades  Painful or persistently difficult swallowing  New shortness of breath  Fever of 100F or higher  Black, tarry-looking stools  For urgent or emergent issues, a gastroenterologist can be reached at any hour by calling [phone]. Do not use  MyChart messaging for urgent concerns.    DIET:  We do recommend a small meal at first, but then you may proceed to your regular diet.  Drink plenty of fluids but you should avoid alcoholic beverages for 24 hours.  ACTIVITY:  You should plan to take it easy for the rest of today and you should NOT DRIVE or use heavy machinery until tomorrow (because of the sedation medicines used during the test).    FOLLOW UP: Our staff will call the number listed on your records the next business day following your procedure.  We will call around 7:15- 8:00 am to check on you and address any questions or concerns that you may have regarding the information given to you following your procedure. If we do not reach you, we will leave a message.     If any biopsies were taken you will be contacted by phone or by letter within the next 1-3 weeks.  Please call us  at [phone] if you have not heard about the biopsies in 3 weeks.    SIGNATURES/CONFIDENTIALITY: You and/or your care partner have signed paperwork which will be entered into your electronic medical record.  These signatures attest to the fact that that the information above on your After Visit Summary has been reviewed and is understood.  Full responsibility of the confidentiality of this discharge information lies with you and/or your care-partner.

## 2024-06-12 NOTE — Progress Notes (Signed)
 Pt's states no medical or surgical changes since previsit or office visit.

## 2024-06-12 NOTE — Op Note (Signed)
 [facility] Patient Name: Jeffrey Liu Procedure Date: 06/12/2024 8:39 AM MRN: [id number] Endoscopist: Aloha Finner , MD, [id number] Age: 68 Referring MD:  Date of Birth: 12-16-1955 Gender: Male Account #: [id number] Procedure:                Upper GI endoscopy Indications:              Iron deficiency anemia, Dyspepsia, Indigestion Medicines:                Monitored Anesthesia Care Procedure:                Pre-Anesthesia Assessment:                           - Prior to the procedure, a History and Physical                            was performed, and patient medications and                            allergies were reviewed. The patient's tolerance of                            previous anesthesia was also reviewed. The risks                            and benefits of the procedure and the sedation                            options and risks were discussed with the patient.                            All questions were answered, and informed consent                            was obtained. Prior Anticoagulants: The patient                            last took Eliquis  (apixaban ) on the day of the                            procedure and has taken no anticoagulant or                            antiplatelet agents except for aspirin. ASA Grade                            Assessment: II - A patient with mild systemic                            disease. After reviewing the risks and benefits,                            the patient was deemed in satisfactory condition to  undergo the procedure.                           After obtaining informed consent, the endoscope was                            passed under direct vision. Throughout the                            procedure, the patient's blood pressure, pulse, and                            oxygen saturations were monitored continuously. The                            GIF [id number] #[id number] was  introduced through the                            mouth, and advanced to the second part of duodenum.                            The upper GI endoscopy was accomplished without                            difficulty. The patient tolerated the procedure. Scope In: Scope Out: Findings:                 A single area of ectopic gastric mucosa was found                            in the proximal esophagus, 19 cm from the incisors.                           No gross lesions were noted in the entire esophagus.                           The Z-line was irregular and was found 44 cm from                            the incisors.                           A 3 cm hiatal hernia was present.                           Patchy mildly erythematous mucosa without bleeding                            was found in the entire examined stomach. Biopsies                            were taken with a cold forceps for histology and  Helicobacter pylori testing.                           No gross lesions were noted in the duodenal bulb,                            in the first portion of the duodenum and in the                            second portion of the duodenum. Biopsies for                            histology were taken with a cold forceps for                            evaluation of celiac disease. Complications:            No immediate complications. Estimated Blood Loss:     Estimated blood loss was minimal. Impression:               - Ectopic gastric mucosa in the proximal esophagus.                           - No other gross lesions in the entire esophagus.                           - Z-line irregular, 44 cm from the incisors.                           - 3 cm hiatal hernia.                           - Erythematous mucosa in the stomach. Biopsied.                           - No gross lesions in the duodenal bulb, in the                            first portion of the duodenum and  in the second                            portion of the duodenum. Biopsied. Recommendation:           - The patient will be observed post-procedure,                            until all discharge criteria are met.                           - Discharge patient to home.                           - Patient has a contact number available for  emergencies. The signs and symptoms of potential                            delayed complications were discussed with the                            patient. Return to normal activities tomorrow.                            Written discharge instructions were provided to the                            patient.                           - Resume previous diet.                           - Continue present medications. Including                            anticoagulation and aspirin today.                           - Await pathology results.                           - We will increase omeprazole  to 40 mg twice daily                            for 1 month to see if any difference in symptoms,                            as we await pathology findings.                           - Review of MRI/MRCP shows evidence of liver                            hemangiomas as well as a large hemangioma of the                            left lobe of the liver. Not clear this would be                            causing symptoms of dyspepsia that he is describing.                           - If negative for H. pylori, consider short course                            of Carafate therapy. Could also consider functional  dyspepsia and trial of TCA                           - Further thoughts/discussions as to whether                            gallbladder could be playing any role. No evidence                            of biliary sludge or biliary dilation, but                            ultrasound would be more ideal to  evaluate the                            gallbladder more closely. We may consider this.                           - The findings and recommendations were discussed                            with the patient.                           - The findings and recommendations were discussed                            with the patient's family. Aloha Finner, MD 06/12/2024 9:04:34 AM

## 2024-06-12 NOTE — Progress Notes (Signed)
 Called to room to assist during endoscopic procedure.  Patient ID and intended procedure confirmed with present staff. Received instructions for my participation in the procedure from the performing physician.

## 2024-06-12 NOTE — Progress Notes (Signed)
 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  Past Medical History:  Diagnosis Date   Aneurysm of ascending aorta without rupture (HCC) 09/12/2023   Anxiety    Anxiety and depression 10/14/2023   Cancer (HCC)    skin cancers/melanoma and basal cell.   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/08/2023   Echo- There is moderately severe (3+) aortic valve regurgitation     Palpitations    Primary open angle glaucoma of both eyes, unspecified glaucoma stage    Pure hypercholesterolemia    Tinnitus of both ears    Past Surgical History:  Procedure Laterality Date   CARDIOVERSION N/A 12/15/2023   Procedure: CARDIOVERSION;  Surgeon: Barbaraann Darryle Ned, MD;  Location: [facility] INVASIVE CV LAB;  Service: Cardiovascular;  Laterality: N/A;   COLONOSCOPY  2015, 2020   phargeal diverticulum     Current Outpatient Medications  Medication Sig Dispense Refill   apixaban  (ELIQUIS ) 5 MG TABS tablet TAKE 1 TABLET BY MOUTH 2 TIMES A DAY 60 tablet 5   aspirin 81 MG chewable tablet Chew 81 mg by mouth in the morning.     famotidine (PEPCID) 20 MG tablet Take 20 mg by mouth daily.     Fluoxetine HCl, PMDD, 20 MG TABS Take 20 mg by mouth daily.     melatonin 5 MG TABS Take 5 mg by mouth at bedtime as needed (Sleep).     Multiple Vitamin (MULTIVITAMIN WITH MINERALS) TABS tablet Take 1 tablet by mouth daily.      Omeprazole  20 MG TBEC Take 20 mg by mouth daily before breakfast. (Patient taking differently: Take 20 mg by mouth 2 (two) times daily.)     rosuvastatin (CRESTOR) 10 MG tablet Take 10 mg by mouth 2 (two) times a week. Wednesdays & Sundays     Current Facility-Administered Medications  Medication Dose Route Frequency Provider Last Rate Last Admin   0.9 %  sodium chloride  infusion  500 mL Intravenous Once Mansouraty, Rebeccah Ivins Jr., MD        Current Outpatient Medications:    apixaban  (ELIQUIS ) 5 MG TABS tablet, TAKE 1 TABLET BY MOUTH 2 TIMES A DAY, Disp: 60 tablet, Rfl: 5   aspirin 81 MG chewable tablet, Chew 81 mg by mouth in the morning., Disp: , Rfl:    famotidine (PEPCID) 20 MG tablet, Take 20 mg by mouth daily., Disp: , Rfl:    Fluoxetine HCl, PMDD, 20 MG TABS, Take 20 mg by mouth daily., Disp: , Rfl:    melatonin 5 MG TABS, Take 5 mg by mouth at bedtime as needed (Sleep)., Disp: , Rfl:    Multiple Vitamin (MULTIVITAMIN WITH MINERALS) TABS tablet, Take 1 tablet by mouth daily., Disp: , Rfl:    Omeprazole  20 MG TBEC, Take 20 mg by mouth daily before  breakfast. (Patient taking differently: Take 20 mg by mouth 2 (two) times daily.), Disp: , Rfl:    rosuvastatin (CRESTOR) 10 MG tablet, Take 10 mg by mouth 2 (two) times a week. Wednesdays & Sundays, Disp: , Rfl:   Current Facility-Administered Medications:    0.9 %  sodium chloride  infusion, 500 mL, Intravenous, Once, Mansouraty, Aloha Raddle., MD Allergies  Allergen Reactions   Erythromycin Other (See Comments) and Nausea Only    GI UPSET   Family History  Problem Relation Age of Onset   Colon cancer Neg Hx    Colon polyps Neg Hx    Esophageal cancer Neg Hx    Rectal cancer Neg Hx    Stomach cancer Neg Hx    Social History   Socioeconomic History   Marital status: Married    Spouse name: Not on file   Number of children: Not on file   Years of education: Not on file   Highest education level: Not on file  Occupational History    Not on file  Tobacco Use   Smoking status: Never   Smokeless tobacco: Never  Vaping Use   Vaping status: Never Used  Substance and Sexual Activity   Alcohol use: Yes    Comment: occ   Drug use: Never   Sexual activity: Not on file  Other Topics Concern   Not on file  Social History Narrative   Not on file   Social Drivers of Health   Financial Resource Strain: Not on file  Food Insecurity: Not on file  Transportation Needs: Not on file  Physical Activity: Not on file  Stress: Not on file  Social Connections: Not on file  Intimate Partner Violence: Not on file    Physical Exam: Today's Vitals   06/12/24 0719 06/12/24 0731  BP: (!) 108/53   Pulse: 62   Temp: 98.1 F (36.7 C) 98.1 F (36.7 C)  SpO2: 97%   Weight: 185 lb (83.9 kg)   Height: 6' 4 (1.93 m)    Body mass index is 22.52 kg/m. GEN: NAD EYE: Sclerae anicteric ENT: MMM CV: Non-tachycardic GI: Soft, NT/ND NEURO:  Alert & Oriented x 3  Lab Results: No results for input(s): WBC, HGB, HCT, PLT in the last 72 hours. BMET No results for input(s): NA, K, CL, CO2, GLUCOSE, BUN, CREATININE, CALCIUM in the last 72 hours. LFT No results for input(s): PROT, ALBUMIN, AST, ALT, ALKPHOS, BILITOT, BILIDIR, IBILI in the last 72 hours. PT/INR No results for input(s): LABPROT, INR in the last 72 hours.   Impression / Plan: This is a 68 y.o.male who presents for EGD for progressive dyspepsia symptoms and underlying GERD and IDA on PPI therapy (recent full colonoscopy).  The risks and benefits of endoscopic evaluation/treatment were discussed with the patient and/or family; these include but are not limited to the risk of perforation, infection, bleeding, missed lesions, lack of diagnosis, severe illness requiring hospitalization, as well as anesthesia and sedation related illnesses.  The patient's history has been reviewed, patient examined, no change in status, and deemed  stable for procedure.  The patient and/or family is agreeable to proceed.    Aloha Finner, MD [facility] Advanced Endoscopy Office # [phone]

## 2024-06-13 ENCOUNTER — Ambulatory Visit (HOSPITAL_COMMUNITY)

## 2024-06-13 ENCOUNTER — Other Ambulatory Visit: Payer: Self-pay

## 2024-06-13 ENCOUNTER — Ambulatory Visit (HOSPITAL_COMMUNITY)
Admission: RE | Admit: 2024-06-13 | Discharge: 2024-06-13 | Disposition: A | Discharge status: Home / Self Care | Attending: Cardiology | Admitting: Cardiology

## 2024-06-13 ENCOUNTER — Telehealth: Payer: Self-pay | Admitting: *Deleted

## 2024-06-13 ENCOUNTER — Ambulatory Visit (HOSPITAL_BASED_OUTPATIENT_CLINIC_OR_DEPARTMENT_OTHER)

## 2024-06-13 ENCOUNTER — Ambulatory Visit (HOSPITAL_COMMUNITY)
Admission: RE | Disposition: A | Discharge status: Home / Self Care | Payer: Self-pay | Source: Home / Self Care | Attending: Cardiology

## 2024-06-13 DIAGNOSIS — I351 Nonrheumatic aortic (valve) insufficiency: Secondary | ICD-10-CM

## 2024-06-13 DIAGNOSIS — I10 Essential (primary) hypertension: Secondary | ICD-10-CM | POA: Diagnosis not present

## 2024-06-13 DIAGNOSIS — Z7901 Long term (current) use of anticoagulants: Secondary | ICD-10-CM | POA: Insufficient documentation

## 2024-06-13 DIAGNOSIS — I480 Paroxysmal atrial fibrillation: Secondary | ICD-10-CM

## 2024-06-13 DIAGNOSIS — K219 Gastro-esophageal reflux disease without esophagitis: Secondary | ICD-10-CM | POA: Diagnosis not present

## 2024-06-13 DIAGNOSIS — Z79899 Other long term (current) drug therapy: Secondary | ICD-10-CM | POA: Diagnosis not present

## 2024-06-13 DIAGNOSIS — F418 Other specified anxiety disorders: Secondary | ICD-10-CM | POA: Diagnosis not present

## 2024-06-13 DIAGNOSIS — D6869 Other thrombophilia: Secondary | ICD-10-CM | POA: Diagnosis not present

## 2024-06-13 DIAGNOSIS — I483 Typical atrial flutter: Secondary | ICD-10-CM

## 2024-06-13 LAB — POCT ACTIVATED CLOTTING TIME: Activated Clotting Time: 239 s

## 2024-06-13 MED ORDER — MIDAZOLAM HCL 2 MG/2ML IJ SOLN
INTRAMUSCULAR | Status: DC | PRN
  Administered 2024-06-13: 2 mg via INTRAVENOUS

## 2024-06-13 MED ORDER — CEFAZOLIN SODIUM-DEXTROSE 2-4 GM/100ML-% IV SOLN
2.0000 g | Freq: Once | INTRAVENOUS | Status: AC
  Administered 2024-06-13: 2 g via INTRAVENOUS

## 2024-06-13 MED ORDER — ACETAMINOPHEN 325 MG PO TABS: 650.0000 mg | ORAL_TABLET | ORAL | Status: DC | PRN

## 2024-06-13 MED ORDER — FENTANYL CITRATE (PF) 100 MCG/2ML IJ SOLN
INTRAMUSCULAR | Status: AC
  Filled 2024-06-13: qty 2

## 2024-06-13 MED ORDER — SODIUM CHLORIDE 0.9% FLUSH: 3.0000 mL | INTRAVENOUS | Status: DC | PRN

## 2024-06-13 MED ORDER — NITROGLYCERIN 1 MG/10 ML FOR IR/CATH LAB
INTRA_ARTERIAL | Status: DC | PRN
  Administered 2024-06-13: 20 mL

## 2024-06-13 MED ORDER — PROPOFOL 500 MG/50ML IV EMUL
INTRAVENOUS | Status: DC | PRN
  Administered 2024-06-13: 150 ug/kg/min via INTRAVENOUS

## 2024-06-13 MED ORDER — PHENYLEPHRINE HCL-NACL 20-0.9 MG/250ML-% IV SOLN
INTRAVENOUS | Status: DC | PRN
  Administered 2024-06-13: 25 ug/min via INTRAVENOUS

## 2024-06-13 MED ORDER — PHENYLEPHRINE 80 MCG/ML (10ML) SYRINGE FOR IV PUSH (FOR BLOOD PRESSURE SUPPORT)
PREFILLED_SYRINGE | INTRAVENOUS | Status: DC | PRN
  Administered 2024-06-13: 200 ug via INTRAVENOUS

## 2024-06-13 MED ORDER — PROTAMINE SULFATE 10 MG/ML IV SOLN
INTRAVENOUS | Status: DC | PRN
  Administered 2024-06-13: 35 mg via INTRAVENOUS

## 2024-06-13 MED ORDER — SODIUM CHLORIDE 0.9 % IV SOLN: INTRAVENOUS | Status: DC

## 2024-06-13 MED ORDER — ATROPINE SULFATE 1 MG/10ML IJ SOSY
PREFILLED_SYRINGE | INTRAMUSCULAR | Status: DC | PRN
  Administered 2024-06-13: 1 mg via INTRAVENOUS

## 2024-06-13 MED ORDER — SODIUM CHLORIDE 0.9% FLUSH: 3.0000 mL | Freq: Two times a day (BID) | INTRAVENOUS | Status: DC

## 2024-06-13 MED ORDER — SODIUM CHLORIDE 0.9 % IV SOLN: 250.0000 mL | INTRAVENOUS | Status: DC | PRN

## 2024-06-13 MED ORDER — CEFAZOLIN SODIUM-DEXTROSE 2-4 GM/100ML-% IV SOLN
INTRAVENOUS | Status: AC
  Filled 2024-06-13: qty 100

## 2024-06-13 MED ORDER — ROCURONIUM BROMIDE 10 MG/ML (PF) SYRINGE
PREFILLED_SYRINGE | INTRAVENOUS | Status: DC | PRN
  Administered 2024-06-13: 70 mg via INTRAVENOUS
  Administered 2024-06-13: 10 mg via INTRAVENOUS
  Administered 2024-06-13: 20 mg via INTRAVENOUS

## 2024-06-13 MED ORDER — PROPOFOL 10 MG/ML IV BOLUS
INTRAVENOUS | Status: DC | PRN
  Administered 2024-06-13: 60 mg via INTRAVENOUS

## 2024-06-13 MED ORDER — DEXAMETHASONE SODIUM PHOSPHATE 10 MG/ML IJ SOLN
INTRAMUSCULAR | Status: DC | PRN
  Administered 2024-06-13: 10 mg via INTRAVENOUS

## 2024-06-13 MED ORDER — HEPARIN SODIUM (PORCINE) 1000 UNIT/ML IJ SOLN
INTRAMUSCULAR | Status: DC | PRN
Start: 2024-06-13 — End: 2024-06-13
  Administered 2024-06-13: 6000 [IU] via INTRAVENOUS
  Administered 2024-06-13: 13000 [IU] via INTRAVENOUS

## 2024-06-13 MED ORDER — LIDOCAINE 2% (20 MG/ML) 5 ML SYRINGE
INTRAMUSCULAR | Status: DC | PRN
  Administered 2024-06-13: 60 mg via INTRAVENOUS

## 2024-06-13 MED ORDER — APIXABAN 5 MG PO TABS
5.0000 mg | ORAL_TABLET | Freq: Once | ORAL | Status: AC
  Administered 2024-06-13: 5 mg via ORAL
  Filled 2024-06-13: qty 1

## 2024-06-13 MED ORDER — ONDANSETRON HCL 4 MG/2ML IJ SOLN
INTRAMUSCULAR | Status: DC | PRN
  Administered 2024-06-13: 4 mg via INTRAVENOUS

## 2024-06-13 MED ORDER — HEPARIN (PORCINE) IN NACL 1000-0.9 UT/500ML-% IV SOLN
INTRAVENOUS | Status: DC | PRN
  Administered 2024-06-13 (×3): 500 mL

## 2024-06-13 MED ORDER — NITROGLYCERIN 1 MG/10 ML FOR IR/CATH LAB
INTRA_ARTERIAL | Status: AC
  Filled 2024-06-13: qty 20

## 2024-06-13 MED ORDER — SUGAMMADEX SODIUM 200 MG/2ML IV SOLN
INTRAVENOUS | Status: DC | PRN
  Administered 2024-06-13: 200 mg via INTRAVENOUS

## 2024-06-13 MED ORDER — FENTANYL CITRATE (PF) 250 MCG/5ML IJ SOLN
INTRAMUSCULAR | Status: DC | PRN
  Administered 2024-06-13 (×2): 50 ug via INTRAVENOUS

## 2024-06-13 MED ORDER — ONDANSETRON HCL 4 MG/2ML IJ SOLN: 4.0000 mg | Freq: Four times a day (QID) | INTRAMUSCULAR | Status: DC | PRN

## 2024-06-13 MED ORDER — MIDAZOLAM HCL 2 MG/2ML IJ SOLN
INTRAMUSCULAR | Status: AC
  Filled 2024-06-13: qty 2

## 2024-06-13 NOTE — Anesthesia Procedure Notes (Signed)
 Procedure Name: Intubation Date/Time: 06/13/2024 8:06 AM  Performed by: Elby Raelene SAUNDERS, CRNAPre-anesthesia Checklist: Patient identified, Emergency Drugs available, Suction available and Patient being monitored Patient Re-evaluated:Patient Re-evaluated prior to induction Oxygen Delivery Method: Circle System Utilized Preoxygenation: Pre-oxygenation with 100% oxygen Induction Type: IV induction Ventilation: Mask ventilation without difficulty Laryngoscope Size: Glidescope and 4 Grade View: Grade II Tube type: Oral Tube size: 7.5 mm Number of attempts: 1 Airway Equipment and Method: Stylet and Bite block Placement Confirmation: ETT inserted through vocal cords under direct vision, positive ETCO2 and breath sounds checked- equal and bilateral Secured at: 24 cm Tube secured with: Tape Dental Injury: Teeth and Oropharynx as per pre-operative assessment

## 2024-06-13 NOTE — Anesthesia Preprocedure Evaluation (Addendum)
 Anesthesia Evaluation  Patient identified by MRN, date of birth, ID band Patient awake    Reviewed: Allergy & Precautions, NPO status , Patient's Chart, lab work & pertinent test results  Airway Mallampati: II  TM Distance: >3 FB Neck ROM: Full    Dental no notable dental hx.    Pulmonary    Pulmonary exam normal        Cardiovascular hypertension, + dysrhythmias Atrial Fibrillation  Rhythm:Regular Rate:Normal   1. Left ventricular ejection fraction, by estimation, is 60 to 65%. The  left ventricle has normal function. The left ventricle has no regional  wall motion abnormalities. Left ventricular diastolic parameters were  normal. The average left ventricular  global longitudinal strain is -21.5 %. The global longitudinal strain is  normal.   2. Right ventricular systolic function is normal. The right ventricular  size is moderately enlarged.   3. Right atrial size was moderately dilated.   4. The mitral valve is normal in structure. Trivial mitral valve  regurgitation. No evidence of mitral stenosis.   5. The aortic valve is normal in structure. Aortic valve regurgitation is  mild. No aortic stenosis is present. Aortic regurgitation PHT measures 769  msec. Aortic valve area, by VTI measures 1.92 cm. Aortic valve mean  gradient measures 11.0 mmHg. Aortic   valve Vmax measures 2.09 m/s.   6. Aortic root/ascending aorta has been repaired/replaced.   7. The inferior vena cava is normal in size with greater than 50%  respiratory variability, suggesting right atrial pressure of 3 mmHg.   8. Compared to prior echo 08/2023, the aortic root has been replaced with  no aneurysm present and AV resuspended with only mild AI.     Neuro/Psych    GI/Hepatic Neg liver ROS,GERD  Medicated,,  Endo/Other    Renal/GU Lab Results      Component                Value               Date                      NA                       139                  05/30/2024                K                        4.6                 05/30/2024                CO2                      24                  05/30/2024                GLUCOSE                  82                  05/30/2024                BUN  22                  05/30/2024                CREATININE               0.93                05/30/2024                CALCIUM                  9.2                 05/30/2024                EGFR                     88                  05/30/2024                Musculoskeletal   Abdominal Normal abdominal exam  (+)   Peds  Hematology Eliquis   Lab Results      Component                Value               Date                      WBC                      4.4                 05/30/2024                HGB                      12.8 (L)            05/30/2024                HCT                      40.5                05/30/2024                MCV                      90                  05/30/2024                PLT                      225                 05/30/2024              Anesthesia Other Findings   Reproductive/Obstetrics                              Anesthesia Physical Anesthesia Plan  ASA: 3  Anesthesia Plan: General   Post-op Pain Management: Minimal or no pain anticipated   Induction: Intravenous  PONV Risk Score and Plan: 2 and  Propofol  infusion, Ondansetron , Dexamethasone  and TIVA  Airway Management Planned: Oral ETT  Additional Equipment: None  Intra-op Plan:   Post-operative Plan: Extubation in OR  Informed Consent: I have reviewed the patients History and Physical, chart, labs and discussed the procedure including the risks, benefits and alternatives for the proposed anesthesia with the patient or authorized representative who has indicated his/her understanding and acceptance.     Dental advisory given  Plan Discussed with: CRNA  Anesthesia Plan  Comments:          Anesthesia Quick Evaluation

## 2024-06-13 NOTE — Discharge Instructions (Signed)

## 2024-06-13 NOTE — Progress Notes (Signed)
 Patient walked to the bathroom without difficulties. Bilateral groins level 0, clean, dry, and intact.

## 2024-06-13 NOTE — H&P (Addendum)
 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    Patient reports doing relatively well since his last clinic visit.  He has used an Scientist, physiological to monitor his heart rate and rhythm.  He reports no known recurrences of atrial arrhythmias.  He has returned back to his baseline level of activity.  He is doing strenuous exercise on a daily basis without issue.  He has no new or acute complaints today.  Interval: Since last clinic visit, patient has had multiple episodes of symptomatic AF, confirmed by wearable device. Decided to pursue ablation. Doing well otherwise. No new or acute complaints.     Review of systems complete and found to be negative unless listed in HPI.    EP Information / Studies Reviewed:      EKG Interpretation Date/Time:                  Friday Mar 22 2024 13:39:20 EDT Ventricular Rate:         67 PR Interval:                 170 QRS Duration:             96 QT Interval:                 438 QTC Calculation:462 R Axis:                         51   Text Interpretation:Normal sinus rhythm Normal ECG When compared with ECG of 15-Dec-2023 08:57, Nonspecific T wave abnormality has replaced inverted T waves in Anterior leads Confirmed by Kitty Fonda [phone]) on 03/22/2024 1:45:15 PM    EKG 12/12/23: AFL    01/24/24 Echo:  1. Left ventricular ejection fraction, by estimation, is 60 to 65%. The  left ventricle has normal function. The left ventricle has no regional  wall motion abnormalities. Left ventricular diastolic parameters were  normal. The average left ventricular  global  longitudinal strain is -21.5 %. The global longitudinal strain is  normal.   2. Right ventricular systolic function is normal. The right ventricular  size is moderately enlarged.   3. Right atrial size was moderately dilated.   4. The mitral valve is normal in structure. Trivial mitral valve  regurgitation. No evidence of mitral stenosis.   5. The aortic valve is normal in structure. Aortic valve regurgitation is  mild. No aortic stenosis is present. Aortic regurgitation PHT measures 769  msec. Aortic valve area, by VTI measures 1.92 cm. Aortic valve mean  gradient measures 11.0 mmHg. Aortic   valve Vmax measures 2.09 m/s.   6. Aortic root/ascending aorta has been repaired/replaced.   7. The inferior vena cava is normal in size with greater than 50%  respiratory variability, suggesting right atrial pressure of 3 mmHg.   8. Compared to prior echo 08/2023, the aortic root has been replaced with  no aneurysm present and AV resuspended with only mild AI.    Risk Assessment/Calculations:     CHA2DS2-VASc Score =  2   This indicates a 2.2% annual risk of stroke. The patient's score is based upon: CHF History: 0 HTN History: 0 Diabetes History: 0 Stroke History: 0 Vascular Disease History: 1 Age Score: 1 Gender Score: 0               Physical Exam:    Today's Vitals   06/13/24 0611 06/13/24 0704  BP: 121/62   Pulse: (!) 55   Resp: 16   Temp: 97.7 F (36.5 C)   SpO2: 98%   Weight: 83.9 kg   Height: 6' 4 (1.93 m)   PainSc:  0-No pain   Body mass index is 22.52 kg/m.   GEN: Well nourished, well developed in no acute distress NECK: No JVD CARDIAC: Normal rate, regular rhythm. RESPIRATORY:  Clear to auscultation without rales, wheezing or rhonchi  ABDOMEN: Soft, non-distended EXTREMITIES:  No edema; No deformity    ASSESSMENT AND PLAN:      #. Paroxysmal atrial fibrillation: Symptomatic. Confirmed on tracings provided by patient from wearable device. #. Post  operative atrial flutter: Appears typical by ECG but given recent cardiac surgery, cannot exclude atypical. #. 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 -Discussed treatment options today for AF including antiarrhythmic drug therapy and ablation. Discussed risks, recovery and likelihood of success with each treatment strategy. Risk, benefits, and alternatives to EP study and ablation for afib were discussed. These risks include but are not limited to stroke, bleeding, vascular damage, tamponade, perforation, damage to the esophagus, lungs, phrenic nerve and other structures, pulmonary vein stenosis, worsening renal function, coronary vasospasm and death.  Discussed potential need for repeat ablation procedures and antiarrhythmic drugs after an initial ablation. The patient understands these risk and wishes to proceed today. -Continue Eliquis  5mg  BID.   #. 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  -Repeat echocardiogram showed normal LVEF, mild aortic regurgitation. -Continue close follow up with primary cardiologist, Dr. Jeffrie.    Follow up with Dr. Kennyth in 3 months.   Signed, Fonda Kennyth, MD

## 2024-06-13 NOTE — Anesthesia Postprocedure Evaluation (Signed)
 Anesthesia Post Note  Patient: Jeffrey Marcus, MD  Procedure(s) Performed: ATRIAL FIBRILLATION ABLATION     Patient location during evaluation: PACU Anesthesia Type: General Level of consciousness: awake and alert Pain management: pain level controlled Vital Signs Assessment: post-procedure vital signs reviewed and stable Respiratory status: spontaneous breathing, nonlabored ventilation, respiratory function stable and patient connected to nasal cannula oxygen Cardiovascular status: blood pressure returned to baseline and stable Postop Assessment: no apparent nausea or vomiting Anesthetic complications: no   No notable events documented.  Last Vitals:  Vitals:   06/13/24 1230 06/13/24 1300  BP: 109/65 112/66  Pulse: (!) 53 (!) 58  Resp: (!) 24 (!) 24  Temp:    SpO2: 93% 94%    Last Pain:  Vitals:   06/13/24 1054  PainSc: 0-No pain                 Cordella P Skylan Gift

## 2024-06-13 NOTE — Telephone Encounter (Signed)
 No answer after post call. Left a message.

## 2024-06-13 NOTE — Transfer of Care (Signed)
 Immediate Anesthesia Transfer of Care Note  Patient: Jeffrey Marcus, MD  Procedure(s) Performed: ATRIAL FIBRILLATION ABLATION  Patient Location: PACU  Anesthesia Type:General  Level of Consciousness: drowsy  Airway & Oxygen Therapy: Patient Spontanous Breathing and Patient connected to nasal cannula oxygen  Post-op Assessment: Report given to RN and Post -op Vital signs reviewed and stable  Post vital signs: Reviewed and stable  Last Vitals:  Vitals Value Taken Time  BP    Temp    Pulse    Resp    SpO2      Last Pain:  Vitals:   06/13/24 0704  PainSc: 0-No pain         Complications: No notable events documented.

## 2024-06-14 ENCOUNTER — Encounter (HOSPITAL_COMMUNITY): Payer: Self-pay | Admitting: Cardiology

## 2024-06-14 LAB — SURGICAL PATHOLOGY

## 2024-06-14 MED FILL — Fentanyl Citrate Preservative Free (PF) Inj 100 MCG/2ML: INTRAMUSCULAR | Qty: 2 | Status: AC

## 2024-06-15 ENCOUNTER — Ambulatory Visit: Payer: Self-pay | Admitting: Gastroenterology

## 2024-06-28 ENCOUNTER — Ambulatory Visit: Admitting: Gastroenterology

## 2024-07-11 ENCOUNTER — Ambulatory Visit (HOSPITAL_COMMUNITY)
Admission: RE | Admit: 2024-07-11 | Discharge: 2024-07-11 | Disposition: A | Discharge status: Home / Self Care | Source: Ambulatory Visit | Attending: Physician Assistant | Admitting: Physician Assistant

## 2024-07-11 VITALS — BP 116/60 | HR 62 | Ht 76.0 in | Wt 188.8 lb

## 2024-07-11 DIAGNOSIS — I480 Paroxysmal atrial fibrillation: Secondary | ICD-10-CM

## 2024-07-11 DIAGNOSIS — D6869 Other thrombophilia: Secondary | ICD-10-CM | POA: Diagnosis not present

## 2024-07-11 DIAGNOSIS — I4891 Unspecified atrial fibrillation: Secondary | ICD-10-CM

## 2024-07-11 NOTE — Progress Notes (Signed)
 Primary Care Physician: Charlott Dorn LABOR, MD Primary Cardiologist: Oneil Parchment, MD Electrophysiologist: Fonda Kitty, MD  Referring Physician: Dr Kitty Wolm Marcus, MD is a 68 y.o. male with a history of aortic aneurysm s/p aortic root replacement and valve resuspension, HLD, atrial flutter, atrial fibrillation who presents for follow up in the [facility].  The patient is s/p afib and flutter ablation with Dr Kitty on 06/13/24. Patient is on Eliquis  for stroke prevention.    Patient presents today for follow up for atrial fibrillation and atrial flutter. He is in SR today and feeling well. He denies chest pain or groin issues. No interim symptoms of afib.   Today, he denies symptoms of palpitations, chest pain, shortness of breath, orthopnea, PND, lower extremity edema, dizziness, presyncope, syncope, snoring, daytime somnolence, bleeding, or neurologic sequela. The patient is tolerating medications without difficulties and is otherwise without complaint today.    Atrial Fibrillation Risk Factors:  he does not have symptoms or diagnosis of sleep apnea. he does not have a history of rheumatic fever.   Atrial Fibrillation Management history:  Previous antiarrhythmic drugs: none Previous cardioversions: none Previous ablations: 06/13/24 Anticoagulation history: Eliquis   ROS- All systems are reviewed and negative except as per the HPI above.  Past Medical History:  Diagnosis Date   Aneurysm of ascending aorta without rupture (HCC) 09/12/2023   Anxiety    Anxiety and depression 10/14/2023   Cancer (HCC)    skin cancers/melanoma and basal cell.   Depression    Dyspepsia    Dysthymia    Family history of prostate cancer    Gastroesophageal reflux disease without esophagitis 10/14/2023   Gilbert's syndrome    Glaucoma    Heartburn    History of adenomatous polyp of colon    History of melanoma    Hyperlipidemia    Liver hemangioma     Low HDL (under 40)    Microcytic anemia    Migraine headache    Nephrolithiasis    Nonrheumatic aortic (valve) insufficiency 08/08/2023   Echo- There is moderately severe (3+) aortic valve regurgitation     Palpitations    Primary open angle glaucoma of both eyes, unspecified glaucoma stage    Pure hypercholesterolemia    Tinnitus of both ears     Current Outpatient Medications  Medication Sig Dispense Refill   apixaban  (ELIQUIS ) 5 MG TABS tablet TAKE 1 TABLET BY MOUTH 2 TIMES A DAY 60 tablet 5   aspirin 81 MG chewable tablet Chew 81 mg by mouth in the morning.     Fluoxetine HCl, PMDD, 20 MG TABS Take 20 mg by mouth daily.     melatonin 5 MG TABS Take 5 mg by mouth at bedtime as needed (Sleep).     Multiple Vitamin (MULTIVITAMIN WITH MINERALS) TABS tablet Take 1 tablet by mouth daily.     omeprazole  (PRILOSEC) 40 MG capsule Take 1 capsule (40 mg total) by mouth 2 (two) times daily. (Patient taking differently: Take 40 mg by mouth daily.) 180 capsule 3   rosuvastatin (CRESTOR) 10 MG tablet Take 10 mg by mouth 2 (two) times a week. Wednesdays & Sundays     Current Facility-Administered Medications  Medication Dose Route Frequency Provider Last Rate Last Admin   0.9 %  sodium chloride  infusion  500 mL Intravenous Once Mansouraty, Aloha Raddle., MD        Physical Exam: BP 116/60   Pulse 62   Ht 6' 4 (  1.93 m)   Wt 85.6 kg   BMI 22.98 kg/m   GEN: Well nourished, well developed in no acute distress CARDIAC: Regular rate and rhythm, no murmurs, rubs, gallops RESPIRATORY:  Clear to auscultation without rales, wheezing or rhonchi  ABDOMEN: Soft, non-tender, non-distended EXTREMITIES:  No edema; No deformity   Wt Readings from Last 3 Encounters:  07/11/24 85.6 kg  06/13/24 83.9 kg  06/12/24 83.9 kg     EKG today demonstrates  SR Vent. rate 62 BPM PR interval 178 ms QRS duration 94 ms QT/QTcB 426/432 ms   Echo 01/24/24 demonstrated   1. Left ventricular ejection fraction,  by estimation, is 60 to 65%. The  left ventricle has normal function. The left ventricle has no regional  wall motion abnormalities. Left ventricular diastolic parameters were  normal. The average left ventricular global longitudinal strain is -21.5 %. The global longitudinal strain is normal.   2. Right ventricular systolic function is normal. The right ventricular  size is moderately enlarged.   3. Right atrial size was moderately dilated.   4. The mitral valve is normal in structure. Trivial mitral valve  regurgitation. No evidence of mitral stenosis.   5. The aortic valve is normal in structure. Aortic valve regurgitation is  mild. No aortic stenosis is present. Aortic regurgitation PHT measures 769  msec. Aortic valve area, by VTI measures 1.92 cm. Aortic valve mean  gradient measures 11.0 mmHg. Aortic   valve Vmax measures 2.09 m/s.   6. Aortic root/ascending aorta has been repaired/replaced.   7. The inferior vena cava is normal in size with greater than 50%  respiratory variability, suggesting right atrial pressure of 3 mmHg.   8. Compared to prior echo 08/2023, the aortic root has been replaced with  no aneurysm present and AV resuspended with only mild AI.    CHA2DS2-VASc Score = 2  The patient's score is based upon: CHF History: 0 HTN History: 0 Diabetes History: 0 Stroke History: 0 Vascular Disease History: 1 Age Score: 1 Gender Score: 0       ASSESSMENT AND PLAN: Paroxysmal Atrial Fibrillation/typical atrial flutter (ICD10:  I48.0) The patient's CHA2DS2-VASc score is 2, indicating a 2.2% annual risk of stroke.   S/p afib and flutter ablation 06/13/24 Patient appears to be maintaining SR Continue Eliquis  5 mg BID with no missed doses for 3 months post ablation.   Secondary Hypercoagulable State (ICD10:  D68.69) The patient is at significant risk for stroke/thromboembolism based upon his CHA2DS2-VASc Score of 2.  Continue Apixaban  (Eliquis ). No bleeding issues.      Follow up with Dr Kennyth as scheduled.     [facility] [address] [place], [place] [address] [phone]

## 2024-08-13 DIAGNOSIS — R972 Elevated prostate specific antigen [PSA]: Secondary | ICD-10-CM | POA: Diagnosis not present

## 2024-08-13 DIAGNOSIS — E7800 Pure hypercholesterolemia, unspecified: Secondary | ICD-10-CM | POA: Diagnosis not present

## 2024-08-13 DIAGNOSIS — D649 Anemia, unspecified: Secondary | ICD-10-CM | POA: Diagnosis not present

## 2024-08-13 DIAGNOSIS — Z79899 Other long term (current) drug therapy: Secondary | ICD-10-CM | POA: Diagnosis not present

## 2024-08-13 DIAGNOSIS — E559 Vitamin D deficiency, unspecified: Secondary | ICD-10-CM | POA: Diagnosis not present

## 2024-08-13 DIAGNOSIS — K909 Intestinal malabsorption, unspecified: Secondary | ICD-10-CM | POA: Diagnosis not present

## 2024-08-15 DIAGNOSIS — K7689 Other specified diseases of liver: Secondary | ICD-10-CM | POA: Diagnosis not present

## 2024-08-15 DIAGNOSIS — E786 Lipoprotein deficiency: Secondary | ICD-10-CM | POA: Diagnosis not present

## 2024-08-15 DIAGNOSIS — I480 Paroxysmal atrial fibrillation: Secondary | ICD-10-CM | POA: Diagnosis not present

## 2024-08-15 DIAGNOSIS — Z860100 Personal history of colon polyps, unspecified: Secondary | ICD-10-CM | POA: Diagnosis not present

## 2024-08-15 DIAGNOSIS — Z85820 Personal history of malignant melanoma of skin: Secondary | ICD-10-CM | POA: Diagnosis not present

## 2024-08-15 DIAGNOSIS — E611 Iron deficiency: Secondary | ICD-10-CM | POA: Diagnosis not present

## 2024-08-15 DIAGNOSIS — Z79899 Other long term (current) drug therapy: Secondary | ICD-10-CM | POA: Diagnosis not present

## 2024-08-15 DIAGNOSIS — E7800 Pure hypercholesterolemia, unspecified: Secondary | ICD-10-CM | POA: Diagnosis not present

## 2024-08-15 DIAGNOSIS — G43709 Chronic migraine without aura, not intractable, without status migrainosus: Secondary | ICD-10-CM | POA: Diagnosis not present

## 2024-08-15 DIAGNOSIS — Z0000 Encounter for general adult medical examination without abnormal findings: Secondary | ICD-10-CM | POA: Diagnosis not present

## 2024-08-15 DIAGNOSIS — F341 Dysthymic disorder: Secondary | ICD-10-CM | POA: Diagnosis not present

## 2024-08-29 DIAGNOSIS — M65331 Trigger finger, right middle finger: Secondary | ICD-10-CM | POA: Diagnosis not present

## 2024-09-04 ENCOUNTER — Telehealth: Payer: Self-pay

## 2024-09-04 DIAGNOSIS — E611 Iron deficiency: Secondary | ICD-10-CM

## 2024-09-04 NOTE — Telephone Encounter (Signed)
 Message Received: Today Mansouraty, Aloha Raddle., MD  Charlott Dorn LABOR, MD; Anitra Odetta CROME, RN [facility], Thank you for this updated information it is helpful.  I will look for your documents so that we can scanned into the system. GM   Shauntee Karp, Dr. Charlott has let me know that patient has persisting evidence of anemia with iron deficiency based on low iron saturation of 8% and ferritin of 19.1. He has had a colonoscopy within the last few years and had recent upper endoscopy. At this point he would qualify for a video capsule endoscopy. If he is interested in moving forward with completing his iron deficiency anemia evaluation from a GI tract perspective, then go ahead and schedule this for him. Please make a notation into the chart of the labs as outlined by Dr. Charlott here and we will be obtaining a copy of the lab as well which will be scanned once I have seen that as well. Thank you. GM       Previous Messages    ----- Message ----- From: Charlott Dorn LABOR, MD Sent: 08/30/2024   4:44 PM EST To: Aloha Wilhelmenia Raddle., MD  I will send you a copy of the labs as well. Labs from 10/14 - Hgb 12.6 (mild anemia and stable) - Ferritin 19.1 (mildly low) - Iron saturation 8 (low) -Jon ----- Message ----- From: Wilhelmenia Aloha Raddle., MD Sent: 08/28/2024   1:17 PM EDT To: Dorn LABOR Charlott, MD  Do you have recent labs and iron indices? GM ----- Message ----- From: Charlott Dorn LABOR, MD Sent: 08/28/2024  12:09 PM EDT To: Aloha Wilhelmenia Raddle., MD  Hello Dr. Wilhelmenia,  I recently saw Dr. Burnis in the office and he continues to have iron deficiency anemia.  Overall, the anemia is MILD and STABLE. Given that he has had fairly recent EGD and colonoscopy with largely unremarkable findings, would there be a roll for capsule endoscopy?  Thank you for your help with this patient.    -Jon (Internal Medicine with [facility])

## 2024-09-09 NOTE — Telephone Encounter (Signed)
 Capsule endo has been set up for 10/01/24 at 830 am.  Amb referral has been entered and instructions have been sent to My Chart and mailed to the pt home   Instruction have been discussed over the phone and all questions answered to the best of my ability

## 2024-09-13 ENCOUNTER — Ambulatory Visit: Attending: Cardiology | Admitting: Cardiology

## 2024-09-13 ENCOUNTER — Encounter: Payer: Self-pay | Admitting: Cardiology

## 2024-09-13 VITALS — BP 110/68 | HR 75 | Ht 76.0 in | Wt 188.3 lb

## 2024-09-13 DIAGNOSIS — I483 Typical atrial flutter: Secondary | ICD-10-CM

## 2024-09-13 DIAGNOSIS — I4891 Unspecified atrial fibrillation: Secondary | ICD-10-CM

## 2024-09-13 DIAGNOSIS — Z9889 Other specified postprocedural states: Secondary | ICD-10-CM

## 2024-09-13 DIAGNOSIS — I480 Paroxysmal atrial fibrillation: Secondary | ICD-10-CM

## 2024-09-13 DIAGNOSIS — Z8679 Personal history of other diseases of the circulatory system: Secondary | ICD-10-CM | POA: Diagnosis not present

## 2024-09-13 DIAGNOSIS — D6869 Other thrombophilia: Secondary | ICD-10-CM | POA: Diagnosis not present

## 2024-09-13 NOTE — Patient Instructions (Signed)
 Medication Instructions:  Your physician has recommended you make the following change in your medication:  1) STOP taking Eliquis   *If you need a refill on your cardiac medications before your next appointment, please call your pharmacy*  Follow-Up: At [facility], you and your health needs are our priority.  As part of our continuing mission to provide you with exceptional heart care, our providers are all part of one team.  This team includes your primary Cardiologist (physician) and Advanced Practice Providers or APPs (Physician Assistants and Nurse Practitioners) who all work together to provide you with the care you need, when you need it.  Your next appointment:   1 year  Provider:   You may see Fonda Kitty, MD or one of the following Advanced Practice Providers on your designated Care Team:   Charlies Arthur, PA-C Michael Andy Tillery, PA-C Suzann Riddle, NP Daphne Barrack, NP Artist Pouch, PA-C

## 2024-09-13 NOTE — Progress Notes (Signed)
 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   Discussed the use of AI scribe software for clinical note transcription with the patient, who gave verbal consent to proceed.  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 He underwent PVI and CTI ablation on 06/13/24.  He has been doing well and has not experienced any episodes of atrial fibrillation since the ablation procedure. He monitors his condition using a watch, although he was unable to wear it for the past two weeks due to a bandage for trigger finger. He is generally aware of his AFib episodes without the need for constant monitoring.  He is currently on Eliquis . He has a history of paroxysmal atrial fibrillation and atrial flutter, for which he underwent ablation. As an avid exerciser, his type of AFib, often seen in endurance athletes, responds well to ablation.  He has developed iron deficiency anemia. He has undergone an upper GI and colonoscopy, both of which were normal. He is scheduled for a capsule endoscopy to further investigate the cause of his anemia.  He continues to ride his bike regularly and wears compression socks, especially in the morning. He has a history of chronic venous insufficiency, which was worked up prior to his surgery.  Review of systems complete and found to be negative unless listed in HPI.   EP Information / Studies Reviewed:    EKG is ordered today. Personal review as below.  EKG  Interpretation Date/Time:  Friday September 13 2024 14:41:15 EST Ventricular Rate:  67 PR Interval:  170 QRS Duration:  94 QT Interval:  416 QTC Calculation: 439 R Axis:   36  Text Interpretation: Normal sinus rhythm Nonspecific ST abnormality When compared with ECG of 11-Jul-2024 11:48, No significant change was found Confirmed by Kitty Fonda [phone]) on 09/13/2024 2:53:18 PM   EKG 12/12/23: AFL   01/24/24 Echo:  1. Left ventricular ejection fraction, by estimation, is 60 to 65%. The  left ventricle has normal function. The left ventricle has no regional  wall motion abnormalities. Left ventricular diastolic parameters were  normal. The average left ventricular  global longitudinal strain is -21.5 %. The global longitudinal strain is  normal.   2. Right ventricular systolic function is normal. The right ventricular  size is moderately enlarged.   3. Right atrial size was moderately dilated.   4. The mitral valve is normal in structure. Trivial mitral valve  regurgitation. No evidence of mitral stenosis.   5. The aortic valve is normal in structure. Aortic valve regurgitation is  mild. No aortic stenosis is present. Aortic regurgitation PHT measures 769  msec. Aortic valve area, by VTI measures 1.92 cm. Aortic valve mean  gradient measures 11.0 mmHg. Aortic   valve Vmax measures 2.09 m/s.   6. Aortic root/ascending aorta has been repaired/replaced.   7. The inferior vena cava is normal in size with greater than 50%  respiratory variability, suggesting right atrial pressure of 3 mmHg.   8. Compared to prior echo 08/2023,  the aortic root has been replaced with  no aneurysm present and AV resuspended with only mild AI.   Risk Assessment/Calculations:    CHA2DS2-VASc Score = 2   This indicates a 2.2% annual risk of stroke. The patient's score is based upon: CHF History: 0 HTN History: 0 Diabetes History: 0 Stroke History: 0 Vascular Disease History: 1 Age Score: 1 Gender  Score: 0             Physical Exam:   VS:  BP 110/68   Pulse 75   Ht 6' 4 (1.93 m)   Wt 188 lb 4.8 oz (85.4 kg)   SpO2 96%   BMI 22.92 kg/m    Wt Readings from Last 3 Encounters:  09/13/24 188 lb 4.8 oz (85.4 kg)  07/11/24 188 lb 12.8 oz (85.6 kg)  06/13/24 185 lb (83.9 kg)     GEN: Well nourished, well developed in no acute distress NECK: No JVD CARDIAC: Normal rate, regular rhythm. RESPIRATORY:  Clear to auscultation without rales, wheezing or rhonchi  ABDOMEN: Soft, non-distended EXTREMITIES:  No edema; No deformity   ASSESSMENT AND PLAN:    #. Paroxysmal atrial fibrillation: S/p PVI on 06/13/24. No known recurrence.  #. Post operative atrial flutter: S/p CTI ablation on 06/13/24. No known recurrence.  - He has metoprolol  as needed. - Continue to monitor with wearable device.  #. 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 - Patient would like to discontinue oral anticoagulation given his chronic anemia.  Given that he has a CHA2DS2-VASc of only 2 and has not had recurrence since ablation I believe this is reasonable.  He will monitors himself closely with wearable device.  He was also very symptomatic during his episodes.  Risk and benefits of oral anticoagulation for stroke prophylaxis have been discussed.  Patient elected to stop oral anticoagulation and start aspirin 81 mg once daily.  #. 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  -Repeat echocardiogram showed normal LVEF, mild aortic regurgitation. -Continue close follow up with primary cardiologist, Dr. Jeffrie.   Follow up with Dr. Kennyth in 6 months.  Signed, Fonda Kennyth, MD

## 2024-09-16 ENCOUNTER — Telehealth: Payer: Self-pay | Admitting: Gastroenterology

## 2024-09-16 NOTE — Telephone Encounter (Signed)
 Inbound call from patient stating that he has a couple of question in regards to his capsule endoscopy. Patient is requesting a call back. Please advise.

## 2024-09-16 NOTE — Telephone Encounter (Signed)
 I spoke with the pt over the phone and discussed the instructions for capsule endo in detail. All questions answered to the best of my ability.  He will call if he has any further questions or concerns

## 2024-09-17 ENCOUNTER — Encounter: Payer: Self-pay | Admitting: Cardiology

## 2024-09-18 NOTE — Telephone Encounter (Signed)
 Spoke with pt on phone to schedule follow up appt.

## 2024-10-01 ENCOUNTER — Ambulatory Visit: Admitting: Cardiology

## 2024-10-01 ENCOUNTER — Ambulatory Visit: Admitting: Gastroenterology

## 2024-10-01 ENCOUNTER — Telehealth: Payer: Self-pay | Admitting: Gastroenterology

## 2024-10-01 NOTE — Patient Instructions (Signed)
 You may have a clear liquid diet at 10:30 am  You may have a light lunch beginning at 12:30 ( ex 1/2 sandwich and a bowl of soup)  You may resume your normal diet at 5:00 pm  If you experience any abdominal pain, nausea ,or vomiting after ingesting the capsule please call [phone] to notify the nurse  You may not have an MRI until you have confirmation that you have passed the capsule.   Return at 4 PM today to return equipment.

## 2024-10-01 NOTE — Progress Notes (Unsigned)
 Capsule ID: [id number] Exp: 2025-11-01 LOT: [id number]  Patient arrived for VCE. Reported the prep went well. This RN explained capsule dietary restrictions for the next few hours. Pt advised to return at 4 pm to return capsule equipment.  Patient verbalized understanding. Opened capsule, ensured capsule was flashing prior to the patient swallowing the capsule. Patient swallowed capsule without difficulty.  Patient told to call the office with any questions and if capsule has not passed after 72 hours. No further questions by the conclusion of the visit.

## 2024-10-01 NOTE — Telephone Encounter (Signed)
 Patient requesting f/u call in regards to capsul endo. States the capsule is stuck in the back of his throat. Please advise.   Thank you

## 2024-10-01 NOTE — Telephone Encounter (Signed)
 Spoke to patient. Patient states he had esophageal diverticula about 20 years ago which was corrected. Patient states he does have issues swallowing pills at times, in which he has to eat part of a banana to get it down. Reports he thought the capsule went down however it is stuck in the back of his throat. Reviewed with Dr Charlanne. Instructed to try clear liquids now. If capsule does not go down, try 1/2 of a banana. If that does not work call or report back to the office. Patient verbalized understanding.

## 2024-10-04 ENCOUNTER — Ambulatory Visit: Admitting: Cardiology

## 2024-10-08 ENCOUNTER — Other Ambulatory Visit: Payer: Self-pay | Admitting: Cardiology

## 2024-10-09 NOTE — Telephone Encounter (Signed)
 Pt last saw Dr Kennyth 09/13/24, last labs 05/30/24 Creat 0.93, age 68, weight 85.4kg, based on specified criteria pt is on appropriate dosage of Eliquis  5mg  BID for afib.  Will refill rx.

## 2024-10-11 NOTE — Progress Notes (Signed)
 Video capsule endoscopy report  Incomplete capsule study as significant portions of the study were not able to have image evaluation. Those portions of the small intestine that could be seen for negative for any signs of a potential source of iron deficiency. Capsule made its way to the colon.  Recommend patient be rescheduled once we have new updated equipment.  I have updated and spoken with the patient about this and we will work on getting him rescheduled as able.  No charge for this capsule report.   This will be scanned into the system however.   Aloha Finner, MD [facility] Office # [phone]

## 2024-10-30 ENCOUNTER — Encounter: Payer: Self-pay | Admitting: Gastroenterology

## 2024-11-04 DIAGNOSIS — E611 Iron deficiency: Secondary | ICD-10-CM

## 2024-11-04 DIAGNOSIS — D509 Iron deficiency anemia, unspecified: Secondary | ICD-10-CM

## 2024-11-04 NOTE — Telephone Encounter (Signed)
-----   Message from Aloha Finner, MD sent at 10/11/2024  1:16 PM EST ----- Regarding: New VCE Jayne Peckenpaugh, As this patient's capsule was in adequate.  He needs to be rescheduled once we have the new equipment. Please put this in your queue to work on. I have already spoken with the patient already about these results. Thanks. GM

## 2024-11-05 NOTE — Telephone Encounter (Signed)
 The pt has been rescheduled for 11/25/24 at 830 am for capsule.  All information has been sent to the pt via My Chart and we discussed at length over the phone.  Amb referral has been entered  Form completed and put in Dr Wilhelmenia desk

## 2024-11-06 NOTE — Telephone Encounter (Signed)
 Call from pt requesting to reschedule CAPSULE ENDO. Please advise. Thank you.

## 2024-11-06 NOTE — Telephone Encounter (Signed)
 Appt has been moved to 11/28/24 at 830 am. He has been re-instructed

## 2024-11-18 ENCOUNTER — Encounter: Payer: Self-pay | Admitting: Cardiology

## 2024-11-18 DIAGNOSIS — Z8679 Personal history of other diseases of the circulatory system: Secondary | ICD-10-CM

## 2024-11-19 NOTE — Addendum Note (Signed)
 Addended by: THEOTIS SHARLET PARAS on: 11/19/2024 05:21 PM   Modules accepted: Orders

## 2024-11-21 ENCOUNTER — Ambulatory Visit

## 2024-11-21 DIAGNOSIS — Z8679 Personal history of other diseases of the circulatory system: Secondary | ICD-10-CM

## 2024-11-21 LAB — ECHOCARDIOGRAM COMPLETE
AR max vel: 2.49 cm2
AV Area VTI: 2.97 cm2
AV Area mean vel: 2.68 cm2
AV Mean grad: 8 mmHg
AV Peak grad: 15.1 mmHg
Ao pk vel: 1.94 m/s
S' Lateral: 4.66 cm

## 2024-11-23 ENCOUNTER — Ambulatory Visit: Payer: Self-pay | Admitting: Cardiology

## 2024-11-25 ENCOUNTER — Encounter

## 2024-11-26 NOTE — Telephone Encounter (Signed)
"    ° °  [facility] [employer] Encounter      DATE of SERVICE: 11/26/2024 TIME of SERVICE: 9:19 AM  Status: Non-urgent, needs attention  Service/Provider: Cardiac Surgery Noretta MATSU. Rochester, M.D.  Reason for call: Test Results  Contact information: [phone]   Resolution: Sent to inbasket    Comments: Pt called to ask about longevity of his valve after re suspension. Recent echo shows moderate aortic insufficiency (new finding since previous echo). Pt asking if Dr. Rochester thinks additional follow up is necessary in his case. No symptoms (pt had no symptoms prior to surgery in 2024). Pt states he could power share imaging if needed.    Rosaline Carver, RN                        Date of Resolution: 11/26/2024 Time of Resolution 9:19 AM "

## 2024-11-28 ENCOUNTER — Ambulatory Visit: Admitting: Gastroenterology

## 2024-11-28 DIAGNOSIS — D509 Iron deficiency anemia, unspecified: Secondary | ICD-10-CM

## 2024-11-28 NOTE — Patient Instructions (Signed)
 You may have clear liquids beginning at 10:30 am after ingesting the capsule.    You can have a light lunch at 12:30 pm; sandwich and half bowl of soup.  Return to the office at 4 pm to return the equipment.   Return to you normal diet at 5 pm.   Call [phone] and ask for Loraine Bhullar, RN if you have any questions.  You should pass the capsule in your stool 8-48 hours after ingestion. If you have not passed the capsule, after 72 hours, please contact the office at [phone].

## 2024-11-28 NOTE — Progress Notes (Signed)
 ID: [id number]  Exp: 2025-11-01 LOT: [id number]  Patient arrived for VCE. Reported the prep went well. This RN explained capsule dietary restrictions for the next few hours. Pt advised to return at 4 pm to return capsule equipment.  Patient verbalized understanding. Opened capsule, ensured capsule was flashing prior to the patient swallowing the capsule. Patient swallowed capsule without difficulty.  Patient told to call the office with any questions and if capsule has not passed after 72 hours. No further questions by the conclusion of the visit.

## 2024-12-06 ENCOUNTER — Other Ambulatory Visit: Payer: Self-pay | Admitting: Vascular Surgery

## 2024-12-06 DIAGNOSIS — M7989 Other specified soft tissue disorders: Secondary | ICD-10-CM

## 2025-01-02 ENCOUNTER — Encounter

## 2025-01-02 ENCOUNTER — Ambulatory Visit (HOSPITAL_COMMUNITY)

## 2025-01-08 ENCOUNTER — Ambulatory Visit: Admitting: Cardiology

## 2025-01-10 ENCOUNTER — Ambulatory Visit: Admitting: Cardiology
